# Patient Record
Sex: Female | Born: 1965 | Race: White | Hispanic: No | Marital: Single | Smoking: Current every day smoker
Health system: Southern US, Community
[De-identification: ages and names within clinical notes are randomized; demographics above are authoritative.]

## PROBLEM LIST (undated history)

## (undated) DIAGNOSIS — M545 Low back pain, unspecified: Secondary | ICD-10-CM

## (undated) DIAGNOSIS — F419 Anxiety disorder, unspecified: Secondary | ICD-10-CM

## (undated) DIAGNOSIS — G8929 Other chronic pain: Secondary | ICD-10-CM

## (undated) DIAGNOSIS — M1990 Unspecified osteoarthritis, unspecified site: Secondary | ICD-10-CM

## (undated) DIAGNOSIS — M810 Age-related osteoporosis without current pathological fracture: Secondary | ICD-10-CM

## (undated) DIAGNOSIS — K219 Gastro-esophageal reflux disease without esophagitis: Secondary | ICD-10-CM

## (undated) HISTORY — PX: JOINT REPLACEMENT: SHX530

## (undated) HISTORY — DX: Anxiety disorder, unspecified: F41.9

## (undated) HISTORY — PX: OTHER SURGICAL HISTORY: SHX169

---

## 2017-01-15 ENCOUNTER — Ambulatory Visit
Admission: RE | Admit: 2017-01-15 | Discharge: 2017-01-15 | Disposition: A | Discharge status: Home / Self Care | Payer: 59 | Source: Ambulatory Visit | Attending: Internal Medicine | Admitting: Internal Medicine

## 2017-01-15 ENCOUNTER — Other Ambulatory Visit: Payer: Self-pay | Admitting: Internal Medicine

## 2017-01-15 DIAGNOSIS — M7989 Other specified soft tissue disorders: Secondary | ICD-10-CM | POA: Diagnosis not present

## 2017-01-15 DIAGNOSIS — M79604 Pain in right leg: Secondary | ICD-10-CM

## 2017-05-18 ENCOUNTER — Emergency Department
Admission: EM | Admit: 2017-05-18 | Discharge: 2017-05-18 | Disposition: A | Discharge status: Home / Self Care | Payer: 59 | Attending: Emergency Medicine | Admitting: Emergency Medicine

## 2017-05-18 DIAGNOSIS — F17200 Nicotine dependence, unspecified, uncomplicated: Secondary | ICD-10-CM | POA: Diagnosis not present

## 2017-05-18 DIAGNOSIS — K0889 Other specified disorders of teeth and supporting structures: Secondary | ICD-10-CM | POA: Insufficient documentation

## 2017-05-18 MED ORDER — AMOXICILLIN 500 MG PO CAPS: 2000.0000 mg | ORAL_CAPSULE | Freq: Once | ORAL | 0 refills | Status: AC

## 2017-05-18 MED ORDER — ONDANSETRON 4 MG PO TBDP
4.0000 mg | ORAL_TABLET | Freq: Once | ORAL | Status: AC
  Administered 2017-05-18: 4 mg via ORAL
  Filled 2017-05-18: qty 1

## 2017-05-18 MED ORDER — OXYCODONE-ACETAMINOPHEN 5-325 MG PO TABS
1.0000 | ORAL_TABLET | Freq: Once | ORAL | Status: AC
  Administered 2017-05-18: 1 via ORAL
  Filled 2017-05-18: qty 1

## 2017-05-18 NOTE — ED Triage Notes (Signed)
Pt presents to ED with c/o dental pain. Pt reports she lost the crown on the lower left bottom tooth on Saturday and cannot be seen by her dentist until 1230pm on Wednesday. Pt reports she is here for uncontrollable pain.

## 2017-05-18 NOTE — Discharge Instructions (Signed)
OPTIONS FOR DENTAL FOLLOW UP CARE ° °[place] Department of Health and Human Services - Local Safety Net Dental Clinics °http://www.ncdhhs.gov/dph/oralhealth/services/safetynetclinics.htm °  °[facility] (336-562-3123) ° °[facility] [place] (919-933-9087) ° °[facility] (919-663-1744 ext [phone]) ° °[facility] ([phone]) ° °[facility] ([phone]) °This clinic caters to the indigent population and is on a lottery system. °Location: °[facility], [address], [place] °Clinic Hours: °Wednesdays from 6pm - 9pm, patients seen by a lottery system. °For dates, call or go to www.med.[facility].edu/[facility]/patients/Dental-[facility] °Services: °Cleanings, fillings and simple extractions. °Payment Options: °DENTAL WORK IS FREE OF CHARGE. Bring proof of income or support. °Best way to get seen: °Arrive at 5:15 pm - this is a lottery, NOT first come/first serve, so arriving earlier will not increase your chances of being seen. °  °  °[facility] °[phone] °Select option 1 for emergencies °  °Location: °[facility], [address], [place] °Clinic Hours: °No walk-ins accepted - call the day before to schedule an appointment. °Check in times are 9:30 am and 1:30 pm. °Services: °Simple extractions, temporary fillings, pulpectomy/pulp debridement, uncomplicated abscess drainage. °Payment Options: °PAYMENT IS DUE AT THE TIME OF SERVICE.  Fee is usually $100-200, additional surgical procedures (e.g. abscess drainage) may be extra. °Cash, checks, Visa/MasterCard accepted.  Can file Medicaid if patient is covered for dental - patient should call case worker to check. °No discount for [facility] patients. °Best way to get seen: °MUST call the day before and get onto the schedule. Can usually be seen the next 1-2 days. No walk-ins accepted. °  °  °[facility] °[phone] °   °Location: °[facility], [address], [place] °Clinic Hours: °M, W, Th, F 8am or 1:30pm, Tues 9a or 1:30 - first come/first served. °Services: °Simple extractions, temporary fillings, uncomplicated abscess drainage.  You do not need to be an [place] resident. °Payment Options: °PAYMENT IS DUE AT THE TIME OF SERVICE. °Dental insurance, otherwise sliding scale - bring proof of income or support. °Depending on income and treatment needed, cost is usually $50-200. °Best way to get seen: °Arrive early as it is first come/first served. °  °  °[facility] °[phone] °  °Location: °[address] °Clinic Hours: °Mon-Thu 8a-5p °Services: °Most basic dental services including extractions and fillings. °Payment Options: °PAYMENT IS DUE AT THE TIME OF SERVICE. °Sliding scale, up to 50% off - bring proof if income or support. °Medicaid with dental option accepted. °Best way to get seen: °Call to schedule an appointment, can usually be seen within 2 weeks OR they will try to see walk-ins - show up at 8a or 2p (you may have to wait). °  °  °[facility] °919-245-2435 °[place] RESIDENTS ONLY °  °Location: °[facility], [address], [place], [place] [address] °Clinic Hours: By appointment only. °Monday - Thursday 8am-5pm, Friday 8am-12pm °Services: Cleanings, fillings, extractions. °Payment Options: °PAYMENT IS DUE AT THE TIME OF SERVICE. °Cash, Visa or MasterCard. Sliding scale - $30 minimum per service. °Best way to get seen: °Come in to office, complete packet and make an appointment - need proof of income °or support monies for each household member and proof of [place] residence. °Usually takes about a month to get in. °  °  °[facility] °[phone] °  °Location: °[address]   [place] °Clinic Hours: Walk-in Urgent Care Dental Services are offered Monday-Friday  mornings only. °The numbers of emergencies accepted daily is limited to the number of °providers available. °Maximum 15 - Mondays, Wednesdays & Thursdays °Maximum 10 - Tuesdays & Fridays °Services: °You do not need to be a [place] resident to be seen for a dental emergency. °Emergencies are defined as pain, swelling, abnormal bleeding, or dental trauma. Walkins will receive x-rays if needed. °NOTE: Dental cleaning is not an emergency. °Payment Options: °PAYMENT IS DUE AT THE TIME OF SERVICE. °Minimum co-pay is $40.00 for uninsured patients. °Minimum co-pay is $3.00 for Medicaid with dental coverage. °Dental Insurance is accepted and must be presented at time of visit. °Medicare does not cover dental. °Forms of payment: Cash, credit card, checks. °Best way to get seen: °If not previously registered with the clinic, walk-in dental registration begins at 7:15 am and is on a first come/first serve basis. °If previously registered with the clinic, call to make an appointment. °  °  °[facility] °[phone] °[place] RESIDENTS ONLY °  °Location: °[address], [place] °Clinic Hours: °Mon-Thu 10a-2p °Services: Extractions only! °Payment Options: °FREE (donations accepted) - bring proof of income or support °Best way to get seen: °Call and schedule an appointment OR come at 8am on the 1st Monday of every month (except for holidays) when it is first come/first served. °  °  °[facility] °[phone] °  °Location: °[address], [place] °Clinic Hours: °Friday mornings °Services, Payment Options, Best way to get seen: °Call for info °

## 2017-05-18 NOTE — ED Provider Notes (Signed)
[facility] Emergency Department Provider Note  ____________________________________________  Time seen: Approximately 9:40 PM  I have reviewed the triage vital signs and the nursing notes.   HISTORY  Chief Complaint Dental Pain    HPI Madison Malone is a 51 y.o. female presenting to the emergency department with dental pain from a lost inferior 20. Patient states that her pain has been 10 out of 10 in intensity. Patient has been drinking water for relief. Patient has an appointment with her dentist at 12:00 tomorrow. Patient states that she "just needs something temporarily to get through the night". No alleviating measures have been attempted.   History reviewed. No pertinent past medical history.  There are no active problems to display for this patient.   Past Surgical History:  Procedure Laterality Date  . multiple RIGHT knee surgeries      Prior to Admission medications   Medication Sig Start Date End Date Taking? Authorizing Provider  amoxicillin (AMOXIL) 500 MG capsule Take 4 capsules (2,000 mg total) by mouth once. 05/18/17 05/18/17  Orvil FeilWoods, Braian Tijerina M, PA-C    Allergies Patient has no known allergies.  No family history on file.  Social History Social History  Substance Use Topics  . Smoking status: Current Every Day Smoker  . Smokeless tobacco: Never Used  . Alcohol use No     Review of Systems  Constitutional: No fever/chills Eyes: No visual changes. No discharge ENT: Patient has dental pain from lost inferior 20 Cardiovascular: no chest pain. Respiratory: no cough. No SOB. Musculoskeletal: Negative for musculoskeletal pain. Skin: Negative for rash, abrasions, lacerations, ecchymosis. Neurological: Negative for headaches, focal weakness or numbness. ____________________________________________   PHYSICAL EXAM:  VITAL SIGNS: ED Triage Vitals  Enc Vitals Group     BP 05/18/17 1935 (!) 159/102     Pulse Rate 05/18/17  1935 89     Resp 05/18/17 1935 16     Temp 05/18/17 1935 97.7 F (36.5 C)     Temp Source 05/18/17 1935 Oral     SpO2 05/18/17 1935 100 %     Weight 05/18/17 1936 115 lb (52.2 kg)     Height 05/18/17 1936 5\' 5"  (1.651 m)     Head Circumference --      Peak Flow --      Pain Score 05/18/17 1935 10     Pain Loc --      Pain Edu? --      Excl. in GC? --      Constitutional: Alert and oriented. Well appearing and in no acute distress. Eyes: Conjunctivae are normal. PERRL. EOMI. Head: Atraumatic. ENT:      Nose: No congestion/rhinnorhea.      Mouth/Throat: Mucous membranes are moist. Patient has a lost inferior 20. No gingival hypertrophy. No focal edema at the jaw. Neck: Full range of motion. No pain with palpation along the C-spine. Hematological/Lymphatic/Immunilogical: No cervical lymphadenopathy. Cardiovascular: Normal rate, regular rhythm. Normal S1 and S2.  Good peripheral circulation. Respiratory: Normal respiratory effort without tachypnea or retractions. Lungs CTAB. Good air entry to the bases with no decreased or absent breath sounds. Skin:  Skin is warm, dry and intact. No rash noted. Psychiatric: Mood and affect are normal. Speech and behavior are normal. Patient exhibits appropriate insight and judgement.   ____________________________________________   LABS (all labs ordered are listed, but only abnormal results are displayed)  Labs Reviewed - No data to display ____________________________________________  EKG   ____________________________________________  RADIOLOGY   No  results found.  ____________________________________________    PROCEDURES  Procedure(s) performed:    Procedures    Medications  oxyCODONE-acetaminophen (PERCOCET/ROXICET) 5-325 MG per tablet 1 tablet (1 tablet Oral Given 05/18/17 2144)  ondansetron (ZOFRAN-ODT) disintegrating tablet 4 mg (4 mg Oral Given 05/18/17 2144)      ____________________________________________   INITIAL IMPRESSION / ASSESSMENT AND PLAN / ED COURSE  Pertinent labs & imaging results that were available during my care of the patient were reviewed by me and considered in my medical decision making (see chart for details).  Review of the [place] CSRS was performed in accordance of the [facility] prior to dispensing any controlled drugs.     Assessment and plan: Dental Pain:  Patient presents to the emergency department with dental pain from a lost inferior 20. Patient was given a Roxicet in the emergency department for pain. Patient was advised that she would not be receive narcotics for continued dental pain. Patient voice understanding. Patient has an appointment with a local dentist tomorrow at 12:00. Patient was discharged with amoxicillin for dental procedure. Patient states that she accidentally took her amoxicillin described by her dentist prematurely. Patient's dentist had prescribed 2000 mg to be taken before her dental procedure tomorrow. Vital signs are reassuring at discharge aside from hypertension. All patient questions were answered.     ____________________________________________  FINAL CLINICAL IMPRESSION(S) / ED DIAGNOSES  Final diagnoses:  Pain, dental      NEW MEDICATIONS STARTED DURING THIS VISIT:  New Prescriptions   AMOXICILLIN (AMOXIL) 500 MG CAPSULE    Take 4 capsules (2,000 mg total) by mouth once.        This chart was dictated using voice recognition software/Dragon. Despite best efforts to proofread, errors can occur which can change the meaning. Any change was purely unintentional.    Orvil Feil, PA-C 05/18/17 2151    Arnaldo Natal, MD 05/18/17 [phone]

## 2018-10-17 ENCOUNTER — Emergency Department: Payer: BLUE CROSS/BLUE SHIELD

## 2018-10-17 ENCOUNTER — Other Ambulatory Visit: Payer: Self-pay

## 2018-10-17 ENCOUNTER — Emergency Department
Admission: EM | Admit: 2018-10-17 | Discharge: 2018-10-18 | Disposition: A | Discharge status: Home / Self Care | Payer: BLUE CROSS/BLUE SHIELD | Attending: Emergency Medicine | Admitting: Emergency Medicine

## 2018-10-17 DIAGNOSIS — Y999 Unspecified external cause status: Secondary | ICD-10-CM | POA: Diagnosis not present

## 2018-10-17 DIAGNOSIS — S61511A Laceration without foreign body of right wrist, initial encounter: Secondary | ICD-10-CM | POA: Insufficient documentation

## 2018-10-17 DIAGNOSIS — F17210 Nicotine dependence, cigarettes, uncomplicated: Secondary | ICD-10-CM | POA: Insufficient documentation

## 2018-10-17 DIAGNOSIS — Y939 Activity, unspecified: Secondary | ICD-10-CM | POA: Diagnosis not present

## 2018-10-17 DIAGNOSIS — Y929 Unspecified place or not applicable: Secondary | ICD-10-CM | POA: Diagnosis not present

## 2018-10-17 DIAGNOSIS — W25XXXA Contact with sharp glass, initial encounter: Secondary | ICD-10-CM | POA: Diagnosis not present

## 2018-10-17 MED ORDER — DIAZEPAM 2 MG PO TABS
2.0000 mg | ORAL_TABLET | Freq: Once | ORAL | Status: AC
  Administered 2018-10-17: 2 mg via ORAL
  Filled 2018-10-17: qty 1

## 2018-10-17 NOTE — ED Triage Notes (Signed)
Reports dropped a plate.  Has laceration to right arm.  Per first nurse approximately 1 inch.

## 2018-10-18 MED ORDER — LIDOCAINE HCL (PF) 1 % IJ SOLN
INTRAMUSCULAR | Status: AC
  Administered 2018-10-18: 01:00:00
  Filled 2018-10-18: qty 5

## 2018-10-18 MED ORDER — BACITRACIN-NEOMYCIN-POLYMYXIN 400-5-5000 EX OINT
TOPICAL_OINTMENT | CUTANEOUS | Status: AC
  Filled 2018-10-18: qty 1

## 2018-10-18 NOTE — ED Provider Notes (Signed)
[facility] Emergency Department Provider Note  ____________________________________________  Time seen: Approximately 2:45 AM  I have reviewed the triage vital signs and the nursing notes.   HISTORY  Chief Complaint Laceration   HPI Madison Malone is a 52 y.o. female presenting to the emergency department for treatment and evaluation of laceration to the right wrist.  She states that she dropped a plate, and it bounced off the counter and then a large piece fell and cut her.  She states that it bled very heavily despite applying pressure.  She states that she soaked through 2 or 3 towels and has blood all over the kitchen.  At this time bleeding is well controlled.  Tetanus booster is up-to-date.   No past medical history on file.  There are no active problems to display for this patient.   Past Surgical History:  Procedure Laterality Date  . multiple RIGHT knee surgeries      Prior to Admission medications   Medication Sig Start Date End Date Taking? Authorizing Provider  cyclobenzaprine (FLEXERIL) 10 MG tablet Take 10 mg by mouth 3 (three) times daily as needed for muscle spasms.   Yes [provider]    Allergies Patient has no known allergies.  No family history on file.  Social History Social History   Tobacco Use  . Smoking status: Current Every Day Smoker  . Smokeless tobacco: Never Used  Substance Use Topics  . Alcohol use: No  . Drug use: No    Review of Systems  Constitutional: Negative for fever. Respiratory: Negative for cough or shortness of breath.  Musculoskeletal: Negative for myalgias Skin: Positive for laceration to the right wrist Neurological: Negative for numbness or paresthesias. ____________________________________________   PHYSICAL EXAM:  VITAL SIGNS: ED Triage Vitals  Enc Vitals Group     BP 10/17/18 2148 102/62     Pulse Rate 10/17/18 2148 95     Resp 10/17/18 2148 20     Temp 10/17/18  2148 97.8 F (36.6 C)     Temp Source 10/17/18 2148 Oral     SpO2 10/17/18 2148 97 %     Weight 10/17/18 2147 100 lb (45.4 kg)     Height 10/17/18 2147 5\' 5"  (1.651 m)     Head Circumference --      Peak Flow --      Pain Score 10/18/18 0047 6     Pain Loc --      Pain Edu? --      Excl. in GC? --      Constitutional: Well appearing. Eyes: Conjunctivae are clear without discharge or drainage. Nose: No rhinorrhea noted. Mouth/Throat: Airway is patent.  Neck: No stridor. Unrestricted range of motion observed. Cardiovascular: Capillary refill is <3 seconds.  Radial pulse 2+ Respiratory: Respirations are even and unlabored.. Musculoskeletal: Unrestricted range of motion observed. Neurologic: Awake, alert, and oriented x 4.  Skin: 4 cm laceration to the volar aspect of the right wrist overlying the distal radius.  ____________________________________________   LABS (all labs ordered are listed, but only abnormal results are displayed)  Labs Reviewed - No data to display ____________________________________________  EKG  Not indicated. ____________________________________________  RADIOLOGY  3 mm linear density anterior lateral to the radius questionable for foreign body.  No acute fracture identified on x-ray. ____________________________________________   PROCEDURES  .Marland KitchenLaceration Repair Date/Time: 10/18/2018 2:47 AM Performed by: Chinita Pester, FNP Authorized by: Chinita Pester, FNP   Consent:    Consent obtained:  Verbal   Consent given by:  Patient   Risks discussed:  Pain, poor cosmetic result and retained foreign body Anesthesia (see MAR for exact dosages):    Anesthesia method:  Local infiltration   Local anesthetic:  Lidocaine 1% w/o epi Laceration details:    Location: Right wrist.   Length (cm):  4 Repair type:    Repair type:  Intermediate Pre-procedure details:    Preparation:  Patient was prepped and draped in usual sterile  fashion Exploration:    Hemostasis achieved with:  Direct pressure   Contaminated: no   Treatment:    Area cleansed with:  Betadine and saline   Amount of cleaning:  Standard   Irrigation solution:  Sterile saline   Irrigation method:  Syringe   Visualized foreign bodies/material removed: yes   Subcutaneous repair:    Suture technique:  Horizontal mattress Skin repair:    Repair method:  Sutures   Suture size:  5-0   Suture material:  Nylon Approximation:    Approximation:  Close Post-procedure details:    Dressing:  Antibiotic ointment   Patient tolerance of procedure:  Tolerated well, no immediate complications   ____________________________________________   INITIAL IMPRESSION / ASSESSMENT AND PLAN / ED COURSE  Madison Malone is a 52 y.o. female presenting to the emergency department after a reported accidental laceration to the right wrist.  Mattress sutures were inserted to close the subcu and the outer tissues in the wound edges were slightly everted.  She is aware that she needs to have the sutures removed in 10 to 12 days.  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  She was encouraged to have a provider look at them to make sure that the wound appeared to have healed well before removal.  Wound care was discussed with the patient and she will apply antibiotic ointment 2 times per day and keep it clean and dry.  She is to leave it open to air when at no risk of getting it dirty or wet.  She is to keep it covered before bed.  She was advised to return to the emergency department for symptoms of concern if unable to have her primary care provider see her.   Medications  neomycin-bacitracin-polymyxin (NEOSPORIN) [id number] ointment (has no administration in time range)  diazepam (VALIUM) tablet 2 mg (2 mg Oral Given 10/17/18 2330)  lidocaine (PF) (XYLOCAINE) 1 % injection (  Given by Other 10/18/18 0047)     Pertinent  labs & imaging results that were available during my care of the patient were reviewed by me and considered in my medical decision making (see chart for details).  ____________________________________________   FINAL CLINICAL IMPRESSION(S) / ED DIAGNOSES  Final diagnoses:  Wrist laceration, right, initial encounter    ED Discharge Orders    None       Note:  This document was prepared using Dragon voice recognition software and may include unintentional dictation errors.    Chinita Pester, FNP 10/18/18 1610    Merrily Brittle, MD 10/18/18 [phone]

## 2018-10-18 NOTE — Discharge Instructions (Signed)
Do not get the sutured area wet for 24 hours. After 24 hours, shower/bathe as usual and pat the area dry. °Change the bandage 2 times per day and apply antibiotic ointment. °Leave open to air when at no risk of getting the area dirty, but cover at night before bed. °See your PCP or go to Urgent Care in 10-12 days for suture removal or sooner for signs or concern of infection. ° °

## 2018-11-05 ENCOUNTER — Other Ambulatory Visit: Payer: Self-pay

## 2018-11-05 ENCOUNTER — Emergency Department (HOSPITAL_COMMUNITY)
Admission: EM | Admit: 2018-11-05 | Discharge: 2018-11-05 | Disposition: A | Discharge status: Home / Self Care | Payer: BLUE CROSS/BLUE SHIELD | Attending: Emergency Medicine | Admitting: Emergency Medicine

## 2018-11-05 ENCOUNTER — Encounter (HOSPITAL_COMMUNITY): Payer: Self-pay

## 2018-11-05 ENCOUNTER — Emergency Department (HOSPITAL_COMMUNITY): Payer: BLUE CROSS/BLUE SHIELD

## 2018-11-05 DIAGNOSIS — F17210 Nicotine dependence, cigarettes, uncomplicated: Secondary | ICD-10-CM | POA: Diagnosis not present

## 2018-11-05 DIAGNOSIS — Z5189 Encounter for other specified aftercare: Secondary | ICD-10-CM | POA: Insufficient documentation

## 2018-11-05 DIAGNOSIS — M25531 Pain in right wrist: Secondary | ICD-10-CM | POA: Diagnosis not present

## 2018-11-05 LAB — CBC WITH DIFFERENTIAL/PLATELET
Abs Immature Granulocytes: 0.01 10*3/uL (ref 0.00–0.07)
BASOS PCT: 1 %
Basophils Absolute: 0.1 10*3/uL (ref 0.0–0.1)
EOS ABS: 0.1 10*3/uL (ref 0.0–0.5)
Eosinophils Relative: 3 %
HEMATOCRIT: 36.5 % (ref 36.0–46.0)
Hemoglobin: 12.2 g/dL (ref 12.0–15.0)
Immature Granulocytes: 0 %
Lymphocytes Relative: 32 %
Lymphs Abs: 1.5 10*3/uL (ref 0.7–4.0)
MCH: 34.6 pg — ABNORMAL HIGH (ref 26.0–34.0)
MCHC: 33.4 g/dL (ref 30.0–36.0)
MCV: 103.4 fL — AB (ref 80.0–100.0)
Monocytes Absolute: 0.4 10*3/uL (ref 0.1–1.0)
Monocytes Relative: 9 %
NEUTROS ABS: 2.6 10*3/uL (ref 1.7–7.7)
NEUTROS PCT: 55 %
NRBC: 0 % (ref 0.0–0.2)
Platelets: 260 10*3/uL (ref 150–400)
RBC: 3.53 MIL/uL — ABNORMAL LOW (ref 3.87–5.11)
RDW: 12.8 % (ref 11.5–15.5)
WBC: 4.8 10*3/uL (ref 4.0–10.5)

## 2018-11-05 LAB — BASIC METABOLIC PANEL
Anion gap: 7 (ref 5–15)
BUN: 12 mg/dL (ref 6–20)
CALCIUM: 9 mg/dL (ref 8.9–10.3)
CO2: 25 mmol/L (ref 22–32)
CREATININE: 0.58 mg/dL (ref 0.44–1.00)
Chloride: 108 mmol/L (ref 98–111)
GFR calc Af Amer: >60 mL/min (ref >60)
GFR calc non Af Amer: >60 mL/min (ref >60)
GLUCOSE: 91 mg/dL (ref 70–99)
Potassium: 3.5 mmol/L (ref 3.5–5.1)
Sodium: 140 mmol/L (ref 135–145)

## 2018-11-05 MED ORDER — IOPAMIDOL (ISOVUE-370) INJECTION 76%: 100.0000 mL | Freq: Once | INTRAVENOUS | Status: DC | PRN

## 2018-11-05 MED ORDER — IOPAMIDOL (ISOVUE-370) INJECTION 76%
INTRAVENOUS | Status: AC
  Filled 2018-11-05: qty 100

## 2018-11-05 MED ORDER — SODIUM CHLORIDE (PF) 0.9 % IJ SOLN
INTRAMUSCULAR | Status: AC
  Filled 2018-11-05: qty 50

## 2018-11-05 MED ORDER — IOPAMIDOL (ISOVUE-370) INJECTION 76%
100.0000 mL | Freq: Once | INTRAVENOUS | Status: AC | PRN
  Administered 2018-11-05: 100 mL via INTRAVENOUS

## 2018-11-05 MED ORDER — LIDOCAINE HCL (PF) 1 % IJ SOLN
30.0000 mL | Freq: Once | INTRAMUSCULAR | Status: AC
  Administered 2018-11-05: 30 mL via INTRADERMAL
  Filled 2018-11-05: qty 30

## 2018-11-05 MED ORDER — FENTANYL CITRATE (PF) 100 MCG/2ML IJ SOLN
50.0000 ug | Freq: Once | INTRAMUSCULAR | Status: AC
  Administered 2018-11-05: 50 ug via INTRAVENOUS
  Filled 2018-11-05: qty 2

## 2018-11-05 MED ORDER — CEPHALEXIN 500 MG PO CAPS: 500.0000 mg | ORAL_CAPSULE | Freq: Two times a day (BID) | ORAL | 0 refills | Status: AC

## 2018-11-05 NOTE — ED Notes (Signed)
ED Provider at bedside. EDP [place]  

## 2018-11-05 NOTE — ED Notes (Signed)
SURGERY HAND , MD [place] AT BEDSIDE WITH PT

## 2018-11-05 NOTE — ED Notes (Signed)
Patient transported to CT 

## 2018-11-05 NOTE — ED Notes (Signed)
EDPA Provider at bedside. 

## 2018-11-05 NOTE — ED Triage Notes (Signed)
Patient reports that she had sutures removed from her right wrist 4 days ago. Patient reports that she began having swelling and pain at the site and saw her PCP yesterday. patient states she was told to come to the ED to have the wound opened yesterday, but did not have a rie until today.

## 2018-11-05 NOTE — ED Notes (Signed)
SUTURE CART AT BEDSIDE 

## 2018-11-05 NOTE — Discharge Instructions (Addendum)
You have been seen today for a wound check. Please read and follow all provided instructions.   1. Medications: Keflex (antibiotics), usual home medications 2. Treatment: rest, drink plenty of fluids, use splint, and keep arm elevated.  3. Follow Up: Please follow up with your primary doctor in 5 days to have sutures removed and for discussion of your diagnoses and further evaluation after today's visit; if you do not have a primary care doctor use the resource guide provided to find one; Please return to the ER for any new or worsening symptoms. Schedule an appointment to see Dr. Izora Ribas if symptoms return.   Take medications as prescribed. Return to the emergency room for worsening condition or new concerning symptoms. Follow up with your regular doctor. If you don't have a regular doctor use one of the numbers below to establish a primary care doctor.   Emergency Department Resource Guide 1) Find a Doctor and Pay Out of Pocket Although you won't have to find out who is covered by your insurance plan, it is a good idea to ask around and get recommendations. You will then need to call the office and see if the doctor you have chosen will accept you as a new patient and what types of options they offer for patients who are self-pay. Some doctors offer discounts or will set up payment plans for their patients who do not have insurance, but you will need to ask so you aren't surprised when you get to your appointment.  2) Contact Your Local Health Department Not all health departments have doctors that can see patients for sick visits, but many do, so it is worth a call to see if yours does. If you don't know where your local health department is, you can check in your phone book. The CDC also has a tool to help you locate your state's health department, and many state websites also have listings of all of their local health departments.  3) Find a Walk-in Clinic If your illness is not likely to be very  severe or complicated, you may want to try a walk in clinic. These are popping up all over the country in pharmacies, drugstores, and shopping centers. They're usually staffed by nurse practitioners or physician assistants that have been trained to treat common illnesses and complaints. They're usually fairly quick and inexpensive. However, if you have serious medical issues or chronic medical problems, these are probably not your best option.  No Primary Care Doctor: Call Health Connect at  [phone] - they can help you locate a primary care doctor that  accepts your insurance, provides certain services, etc. Physician Referral [phone]  Emergency Department Resource Guide 1) Find a Doctor and Pay Out of Pocket Although you won't have to find out who is covered by your insurance plan, it is a good idea to ask around and get recommendations. You will then need to call the office and see if the doctor you have chosen will accept you as a new patient and what types of options they offer for patients who are self-pay. Some doctors offer discounts or will set up payment plans for their patients who do not have insurance, but you will need to ask so you aren't surprised when you get to your appointment.  2) Contact Your Local Health Department Not all health departments have doctors that can see patients for sick visits, but many do, so it is worth a call to see if yours does. If you  don't know where your local health department is, you can check in your phone book. The CDC also has a tool to help you locate your state's health department, and many state websites also have listings of all of their local health departments.  3) Find a Walk-in Clinic If your illness is not likely to be very severe or complicated, you may want to try a walk in clinic. These are popping up all over the country in pharmacies, drugstores, and shopping centers. They're usually staffed by nurse practitioners or  physician assistants that have been trained to treat common illnesses and complaints. They're usually fairly quick and inexpensive. However, if you have serious medical issues or chronic medical problems, these are probably not your best option.  No Primary Care Doctor: Call Health Connect at  [phone] - they can help you locate a primary care doctor that  accepts your insurance, provides certain services, etc. Physician Referral Service- [phone]  Chronic Pain Problems: Organization         Address  Phone   Notes  [facility]  [phone] Patients need to be referred by their primary care doctor.   Medication Assistance: Organization         Address  Phone   Notes  [facility] [address] [place], [place] [address] [phone] --Must be a resident of [facility] -- Must have NO insurance coverage whatsoever (no Medicaid/ Medicare, etc.) -- The pt. MUST have a primary care doctor that directs their care regularly and follows them in the community   [facility]ist  [phone]   [employer]  [phone]    Agencies that provide inexpensive medical care: Organization         Address  Phone   Notes  [facility]  [phone]   [facility]    [phone]   [facility] [address] [place], [place] [address] [phone]   [facility] [address], [place] [phone]   [facility]anned [facility]    [phone]   [facility]    [phone]   [facility]  [address], [place] Phone:  [phone], Fax:  [phone] Hours of Operation:  9 am - 6 pm, M-F.  Also accepts Medicaid/Medicare and self-pay.  [facility]  [address], [place] Phone: [phone], Fax: [phone]. Hours of Operation:  8:30 am - 5:30 pm, M-F.  Also  accepts Medicaid and self-pay.  [facility] [place] [address], [place] Phone: [phone]) [phone]   [facility] [address], [place], [place] [phone], Ext. [phone] Mondays & Thursdays: 7-9 AM.  First 15 patients are seen on a first come, first serve basis.    Medicaid-accepting [facility] Providers:  Organization         Address  Phone   Notes  [facility] [address],  [phone] Also accepts self-pay patients.  [facility] [address], [place]  [phone]   [facility] [address], [place] [phone]   [facility] [address] [place] [address], [place] [phone]   Renaye RakersVeita Bland [address], [place]   [phone] Only accepts [employer] [place] Access Medicaid  patients after they have their name applied to their card.   Self-Pay (no insurance) in [facility]:  Organization         Address  Phone   Notes  Sickle Cell Patients, [facility] [address] [place], [place] [phone]   [facility] [address] [place], [place] [phone]   [facility] [place]  [address], [place] [phone]   [facility]/Dr. Osei-Bonsu  [address] [place] or [address], [place] [phone] Phone number for both [place] and [place] locations is the same.  [facility] [address], [place] [phone]   [facility] [address] [place] or [address] [phone]   [facility] [address] [place] [phone], phone; [phone], fax Sees patients 1st and 3rd Saturday of every month.  Must not qualify for public or private insurance (i.e. Medicaid, Medicare, [place] Health Choice, Veterans' Benefits)   Household income should be no more than 200% of the poverty level The clinic cannot treat you if you are pregnant or think you are pregnant  Sexually transmitted diseases are not treated at the clinic.

## 2018-11-05 NOTE — ED Notes (Signed)
ACE WITH SPLINT INTACT

## 2018-11-05 NOTE — Consult Note (Signed)
Reason for Consult:right wrist laceration Referring Physician: ER  CC:My wrist hurts  HPI:  Madison Malone is an 52 y.o. left handed female who presents with swelling of right wrist.  Pt lacerated wrist several weeks ago, was seen in an outside ER, wound sutured closed, referred to Meyer Russel Jefm Bryant) who evaluated her and felt nothing further needed to be done.  Se had sutures removed this past Tuesday, states since then wrist became swollen and tender. CTA and [place] here in ER without evidence of vascular injury    .   Pain is rated at   8 /10 and is described as sharp.  Pain is constant.  Pain is made better by rest/immobilization, worse with motion.   Associated signs/symptoms: reports some altered sensation of thenar area Previous treatment:  Excess bleeding when laceration happened, states since initial visit has bled several times  History reviewed. No pertinent past medical history.  Past Surgical History:  Procedure Laterality Date  . multiple RIGHT knee surgeries      Family History  Problem Relation Age of Onset  . Anxiety disorder Mother   . Kidney failure Father     Social History:  reports that she has been smoking. She has been smoking about 1.00 pack per day. She has never used smokeless tobacco. She reports that she does not drink alcohol or use drugs.  Allergies: No Known Allergies  Medications: I have reviewed the patient's current medications.  Results for orders placed or performed during the hospital encounter of 11/05/18 (from the past 48 hour(s))  CBC with Differential     Status: Abnormal   Collection Time: 11/05/18 11:19 AM  Result Value Ref Range   WBC 4.8 4.0 - 10.5 K/uL   RBC 3.53 (L) 3.87 - 5.11 MIL/uL   Hemoglobin 12.2 12.0 - 15.0 g/dL   HCT 36.5 36.0 - 46.0 %   MCV 103.4 (H) 80.0 - 100.0 fL   MCH 34.6 (H) 26.0 - 34.0 pg   MCHC 33.4 30.0 - 36.0 g/dL   RDW 12.8 11.5 - 15.5 %   Platelets 260 150 - 400 K/uL   nRBC 0.0 0.0 - 0.2 %   Neutrophils  Relative % 55 %   Neutro Abs 2.6 1.7 - 7.7 K/uL   Lymphocytes Relative 32 %   Lymphs Abs 1.5 0.7 - 4.0 K/uL   Monocytes Relative 9 %   Monocytes Absolute 0.4 0.1 - 1.0 K/uL   Eosinophils Relative 3 %   Eosinophils Absolute 0.1 0.0 - 0.5 K/uL   Basophils Relative 1 %   Basophils Absolute 0.1 0.0 - 0.1 K/uL   Immature Granulocytes 0 %   Abs Immature Granulocytes 0.01 0.00 - 0.07 K/uL    Comment: Performed at [facility], [place] [address] [place], [place] [address]  Basic metabolic panel     Status: None   Collection Time: 11/05/18 11:19 AM  Result Value Ref Range   Sodium 140 135 - 145 mmol/L   Potassium 3.5 3.5 - 5.1 mmol/L   Chloride 108 98 - 111 mmol/L   CO2 25 22 - 32 mmol/L   Glucose, Bld 91 70 - 99 mg/dL   BUN 12 6 - 20 mg/dL   Creatinine, Ser 0.58 0.44 - 1.00 mg/dL   Calcium 9.0 8.9 - 10.3 mg/dL   GFR calc non Af Amer >60 >60 mL/min   GFR calc Af Amer >60 >60 mL/min    Comment: (NOTE) The eGFR has been calculated using  the CKD EPI equation. This calculation has not been validated in all clinical situations. eGFR's persistently <60 mL/min signify possible Chronic Kidney Disease.    Anion gap 7 5 - 15    Comment: Performed at [facility], [place] [address], [place], [place] [address]    Ct Angio Up Extrem Right W &/or Wo Contrast  Result Date: 11/05/2018 CLINICAL DATA:  Pain near right wrist. Swollen. Pt states when she presses on the knot that blood shoots out."Patient reports that she had sutures removed from her right wrist 4 days ago. Patient reports that she began having swelling and pain at the site and saw her PCP yesterday. patient states she was told to come to the ED to have the wound opened yesterday, but did not have a rie until today." EXAM: CT ANGIOGRAPHY OF THE RIGHT UPPEREXTREMITY TECHNIQUE: Multidetector CT imaging of the right upperwas performed using the standard protocol during bolus administration of intravenous  contrast. Multiplanar CT image reconstructions and MIPs were obtained to evaluate the vascular anatomy. CONTRAST:  116m ISOVUE-370 IOPAMIDOL (ISOVUE-370) INJECTION 76% COMPARISON:  Radiographs 10/17/2018 FINDINGS: Right subclavian artery: Incompletely visualized only its peripheral segment, unremarkable Right axillary artery unremarkable Right brachial artery normal. Radial, ulnar, and interosseous arteries are patent. 2 cm soft tissue attenuation process in the distal forearm subcutaneous tissues overlying the distal radial artery. Radial and ulnar arteries cross the wrist to supply the hand. Palmar arches and distal branches incompletely evaluated. Regional soft tissues otherwise unremarkable. Review of the MIP images confirms the above findings. IMPRESSION: 1. No right upper extremity arterial abnormality identified. 2. Nonspecific 2 cm process overlying the radial artery in the distal forearm. Primary considerations include hematoma, pseudoaneurysm, seroma, abscess. Electronically Signed   By: DLucrezia EuropeM.D.   On: 11/05/2018 14:47    Pertinent items are noted in HPI. Temp:  [97.7 F (36.5 C)-98.2 F (36.8 C)] 98.2 F (36.8 C) (11/16 1453) Pulse Rate:  [82-106] 91 (11/16 1453) Resp:  [16-19] 18 (11/16 1453) BP: (140-156)/(95-100) 140/95 (11/16 1453) SpO2:  [99 %-100 %] 99 % (11/16 1453) Weight:  [45.4 kg] 45.4 kg (11/16 0951) General appearance: alert and cooperative Resp:  No distress CV: regular Ext:   L wrist with what appears wound dehiscence and buldging hematoma overlying laceration , non pulsatile, allen test + flow with occlusion of ulnar artery, no signif surrounding erythema to suggest abscess  Assessment: S/p laceration with hematoma Plan: Will anesthetize, evacuate hematoma, if active bleeding occurs will take to OR.  Procedure:  Skin cleansed and area around swelling anesthetized with plain lidocaine.  Wound opened, hematoma evacuated.  No active bleeding was encountered.   Wound irrigated with saline.  Loosely closed with 2 4-0 nylon sutures; dressing placed.  Pt tolerated well. Pt to wear splint L wrist, no lifting, excessive bending of wrist, will need sutures removed in ~1 wk.  Janaki Exley CVivien Presto11/16/2019, 4:26 PM

## 2018-11-05 NOTE — ED Notes (Signed)
ED Provider at bedside. LOCKWO[facility] UPDATED PT

## 2018-11-05 NOTE — ED Provider Notes (Signed)
11:15 AM Patient seen in conjunction with Marta AntuHernandez PA-C.  Patient with right wrist laceration, repaired on 10/29.  Patient had subsequent issues with bleeding.  When the stitches were removed she had continued episodes of bleeding with the development of swelling over the cut.  I used bedside ultrasound to better visualize the area.  Patient's radial artery is directly adjacent to the area of swelling.  There appears to be some internal echoes, question hematoma.   Discussed with Dr. Jeraldine LootsLockwood who has seen patient.  Agrees that we should proceed with CT angiography of the forearm and wrist to evaluate for vascular injury.  Otherwise, the hand is perfused.  Patient does have some numbness over the thenar eminence.  She is able to move her wrist and fingers.  I suspect this is from a cutaneous nerve injury.  4:27 PM Spoke with Dr. Izora Ribasoley earlier. He will see patient in ED.   BP (!) 140/95 (BP Location: Left Arm)   Pulse 91   Temp 98.2 F (36.8 C) (Oral)   Resp 18   Ht 5\' 5"  (1.651 m)   Wt 45.4 kg   SpO2 99%   BMI 16.64 kg/m     Renne CriglerGeiple, Rogenia Werntz, PA-C 11/05/18 1627    Gerhard MunchLockwood, Robert, MD 11/06/18 [phone]

## 2018-11-05 NOTE — ED Notes (Signed)
RX SENT TO PHARMACY

## 2018-11-05 NOTE — ED Provider Notes (Signed)
[facility]-EMERGENCY DEPT Provider Note   CSN: [id number] Arrival date & time: 11/05/18  [id number]     History   Chief Complaint Chief Complaint  Patient presents with  . Wound Check    HPI Madison Malone is a 52 y.o. female presenting for a wound check that appears to not be improving. Patient reports constant throbbing pain on the plantar aspect of her right wrist and states pain radiates to her right thumb. Patient reports she had a plate break and fall on her right wrist on 10/28. She was seen in the ER and had sutures placed. Patient reports she continued to have a lot of bleeding after her ER visit and followed up with her PCP. Patient states she was placed on Amoxicillin for 7 days and finished the course of antibiotics. Patient was seen last week at her PCP and her her sutures removed without any concerns. Patient states after her sutures were removed, she started experiencing pain, edema, and color change in her wound. Patient denies discharge from the wound, but states it occasionally continues to have minimal bleeding. Patient reports numbness and pain on her thumb, but reports being able to move all fingers. Patient denies fever, night sweats, shortness of breath, or chest pain. Patient denies a history of diabetes.   HPI  History reviewed. No pertinent past medical history.  There are no active problems to display for this patient.   Past Surgical History:  Procedure Laterality Date  . multiple RIGHT knee surgeries       OB History   None      Home Medications    Prior to Admission medications   Medication Sig Start Date End Date Taking? Authorizing Provider  cyclobenzaprine (FLEXERIL) 10 MG tablet Take 10 mg by mouth 3 (three) times daily as needed for muscle spasms.   Yes [provider]  oxyCODONE-acetaminophen (PERCOCET/ROXICET) 5-325 MG tablet Take 1 tablet by mouth every 6 (six) hours as needed for pain. 11/01/18 11/06/18 Yes  [provider]  cephALEXin (KEFLEX) 500 MG capsule Take 1 capsule (500 mg total) by mouth 2 (two) times daily for 5 days. 11/05/18 11/10/18  Leretha DykesHernandez, Julieana Eshleman P, PA-C    Family History Family History  Problem Relation Age of Onset  . Anxiety disorder Mother   . Kidney failure Father     Social History Social History   Tobacco Use  . Smoking status: Current Every Day Smoker    Packs/day: 1.00  . Smokeless tobacco: Never Used  Substance Use Topics  . Alcohol use: No  . Drug use: No     Allergies   Patient has no known allergies.   Review of Systems Review of Systems  Constitutional: Negative for chills, diaphoresis and fever.  HENT: Negative for nosebleeds.   Respiratory: Negative for cough and shortness of breath.   Cardiovascular: Negative for chest pain.  Gastrointestinal: Negative for abdominal pain, nausea and vomiting.  Skin: Positive for color change and wound.  Allergic/Immunologic: Negative for immunocompromised state.  Neurological: Positive for numbness. Negative for weakness.  Hematological: Does not bruise/bleed easily.  Psychiatric/Behavioral: The patient is not nervous/anxious.      Physical Exam Updated Vital Signs BP (!) 153/92 (BP Location: Left Arm)   Pulse 84   Temp 98.2 F (36.8 C) (Oral)   Resp 17   Ht 5\' 5"  (1.651 m)   Wt 45.4 kg   SpO2 98%   BMI 16.64 kg/m   Physical Exam  Constitutional: She  appears well-developed and well-nourished. No distress.  HENT:  Head: Normocephalic and atraumatic.  Cardiovascular: Normal rate, regular rhythm and normal heart sounds. Exam reveals no gallop and no friction rub.  No murmur heard. Pulmonary/Chest: Effort normal and breath sounds normal. No respiratory distress. She has no wheezes. She has no rales.  Abdominal: Soft. She exhibits no distension. There is no tenderness. There is no guarding.  Musculoskeletal: Normal range of motion.  Neurological: She is alert.  Skin: Skin is warm.  Capillary refill takes less than 2 seconds. Lesion noted. No rash noted. She is not diaphoretic. There is erythema. No cyanosis.     Patient has a 3cm x 3cm purple/black nodule on the volar aspect of her right wrist. Slight surrounding erythema. No discharge noted. Lesion is very tender to palpation. Radial pulse is palpable. Slight sensory deficit noted on right hand when compared to left hand. Patient is able to move fingers without difficulty.   Psychiatric: She has a normal mood and affect.  Nursing note and vitals reviewed.    ED Treatments / Results  Labs (all labs ordered are listed, but only abnormal results are displayed) Labs Reviewed  CBC WITH DIFFERENTIAL/PLATELET - Abnormal; Notable for the following components:      Result Value   RBC 3.53 (*)    MCV 103.4 (*)    MCH 34.6 (*)    All other components within normal limits  BASIC METABOLIC PANEL    EKG None  Radiology Ct Angio Up Extrem Right W &/or Wo Contrast  Result Date: 11/05/2018 CLINICAL DATA:  Pain near right wrist. Swollen. Pt states when she presses on the knot that blood shoots out."Patient reports that she had sutures removed from her right wrist 4 days ago. Patient reports that she began having swelling and pain at the site and saw her PCP yesterday. patient states she was told to come to the ED to have the wound opened yesterday, but did not have a rie until today." EXAM: CT ANGIOGRAPHY OF THE RIGHT UPPEREXTREMITY TECHNIQUE: Multidetector CT imaging of the right upperwas performed using the standard protocol during bolus administration of intravenous contrast. Multiplanar CT image reconstructions and MIPs were obtained to evaluate the vascular anatomy. CONTRAST:  ISOVUE-370 IOPAMIDOL (ISOVUE-370) INJECTION 76% COMPARISON:  Radiographs 10/17/2018 FINDINGS: Right subclavian artery: Incompletely visualized only its peripheral segment, unremarkable Right axillary artery unremarkable Right brachial artery  normal. Radial, ulnar, and interosseous arteries are patent. 2 cm soft tissue attenuation process in the distal forearm subcutaneous tissues overlying the distal radial artery. Radial and ulnar arteries cross the wrist to supply the hand. Palmar arches and distal branches incompletely evaluated. Regional soft tissues otherwise unremarkable. Review of the MIP images confirms the above findings. IMPRESSION: 1. No right upper extremity arterial abnormality identified. 2. Nonspecific 2 cm process overlying the radial artery in the distal forearm. Primary considerations include hematoma, pseudoaneurysm, seroma, abscess. Electronically Signed   By: Corlis Leak M.D.   On: 11/05/2018 14:47    Procedures Procedures (including critical care time)  Medications Ordered in ED Medications  sodium chloride (PF) 0.9 % injection (has no administration in time range)  iopamidol (ISOVUE-370) 76 % injection (has no administration in time range)  iopamidol (ISOVUE-370) 76 % injection 100 mL (has no administration in time range)  iopamidol (ISOVUE-370) 76 % injection 100 mL (100 mLs Intravenous Contrast Given 11/05/18 1351)  fentaNYL (SUBLIMAZE) injection 50 mcg (50 mcg Intravenous Given 11/05/18 1455)  lidocaine (PF) (XYLOCAINE) 1 % injection  30 mL (30 mLs Intradermal Given 11/05/18 1602)  fentaNYL (SUBLIMAZE) injection 50 mcg (50 mcg Intravenous Given 11/05/18 1559)     Initial Impression / Assessment and Plan / ED Course  I have reviewed the triage vital signs and the nursing notes.  Pertinent labs & imaging results that were available during my care of the patient were reviewed by me and considered in my medical decision making (see chart for details).  Clinical Course as of Nov 06 1635  Sat Nov 05, 2018  1355 BMP is unremarkable.    [AH]  1355 WBC is within normal limits.   CBC with Differential(!) [AH]  1501 CT Angio of the upper extremity found a nonspecific 2 cm process overlying the radial artery in  the distal forearm. This result did not determine if lesion is a hematoma, pseudoaneurysm, seroma, or abscess. Will discuss further with attending physician.    CT ANGIO UP EXTREM RIGHT W &/OR WO CONTRAST [AH]  1503 Will consult hand surgery for further assessment of wrist lesion.    [AH]    Clinical Course User Index [AH] Leretha Dykes, PA-C    Patient presents with complaint of wound check. Patient nontoxic appearing, in no apparent distress, vitals WNL, stable.  Labs:  Ordered BMP and CBC to evaluate for kidney function and leukocytosis.   Imaging: Assisted Rhea Bleacher PA-C with bedside ultrasound to evaluate patency of the radial artery. It was not clear with bedside ultrasound. Ordered CT angiogram of the upper extremity to evaluate the patency of the radial artery.   Consults:  Consulted hand surgery to evaluate the patient.   Therapeutics: Provided Fentanyl for pain relief.   Assessment/Plan: Suspect lesion could be a hematoma, pseudoaneurysm, seroma, or abscess. Will not drain due to complexity of the location of lesion. Will need further assessment from hand surgery. Hand surgery evaluated the patient and discussed incision and drainage of the lesion. Patient agreed and hand surgery performed an  I&D. Hand Surgeon instructed patient to follow up with PCP to have sutures removed and continue to wear splint. Hand surgeon suggested patient come back and be seen outpatient by hand surgery if symptoms return. Hand surgery mentioned that if symptoms return, patient will have to go to the OR. Provided Keflex as hand surgery recommended.   Doubt need for further emergent work up at this time. I discussed results, treatment plan, need for PCP follow-up, and return precautions to return to the ER including for any other new or worsening symptoms with the patient. Provided opportunity for questions, patient confirmed understanding and is in agreement with plan. I have answered their  questions. Discharge instructions concerning home care and prescriptions have been given. The patient is STABLE and is discharged to home in good condition. Encouraged patient to follow up with PCP and have PCP obtain results of this visit in 5 days or sooner if needed.    Final Clinical Impressions(s) / ED Diagnoses   Final diagnoses:  Visit for wound check    ED Discharge Orders         Ordered    cephALEXin (KEFLEX) 500 MG capsule  2 times daily     11/05/18 1633           Leretha Dykes, [place] 11/05/18 1636    Gerhard Munch, MD 11/06/18 [phone]

## 2019-05-25 IMAGING — CT CT ANGIO EXTREM UP*R*
2 of 7 series · 9 of 46 positions shown, 10 images · IV contrast (ISOVUE)
Comparison: Radiographs 10/17/2018

CLINICAL DATA: Pain near right wrist. Swollen. Pt states when she
presses on the knot that blood shoots out."Patient reports that she
had sutures removed from her right wrist 4 days ago. Patient reports
that she began having swelling and pain at the site and saw her PCP
yesterday. patient states she was told to come to the ED to have the
wound opened yesterday, but did not have Kaumi Justina until today."

EXAM:
CT ANGIOGRAPHY OF THE RIGHT UPPEREXTREMITY
TECHNIQUE: Multidetector CT imaging of the right upperwas performed using the
standard protocol during bolus administration of intravenous
contrast. Multiplanar CT image reconstructions and MIPs were
obtained to evaluate the vascular anatomy.
CONTRAST:  100mL P3LT4K-1S1 IOPAMIDOL (P3LT4K-1S1) INJECTION 76%

[Series 4: axial arterial · axial · arterial · 0.30mm/px · z∈[+1626,+2190]mm · 6 of 252 slices shown, 7 images]
[im 32/252  soft-tissue]
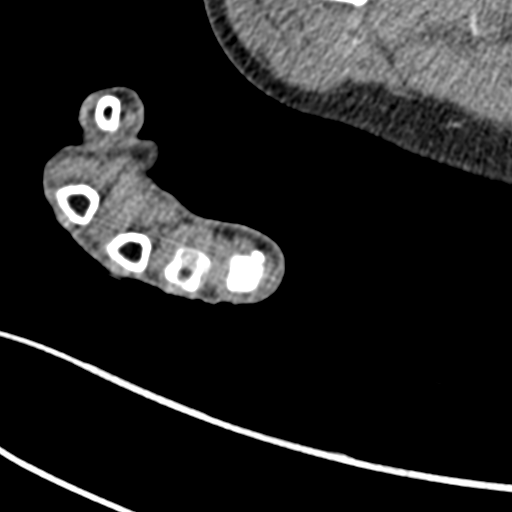
[im 32/252  bone]
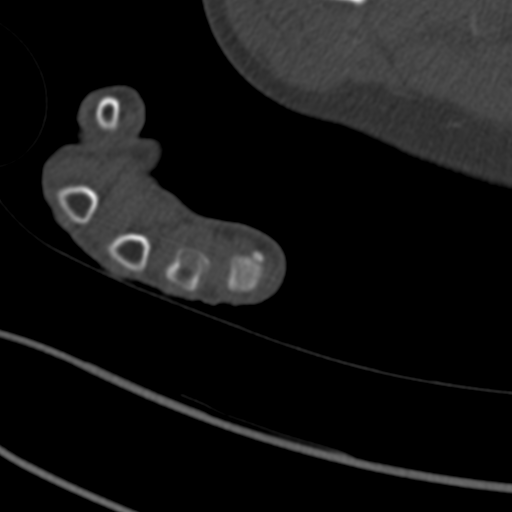
[im 63/252  soft-tissue]
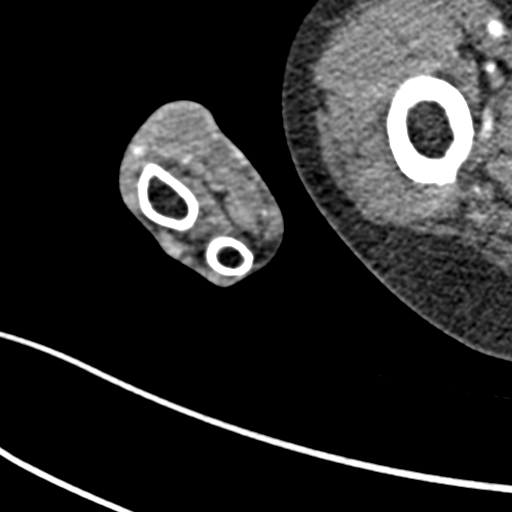
[im 95/252  soft-tissue]
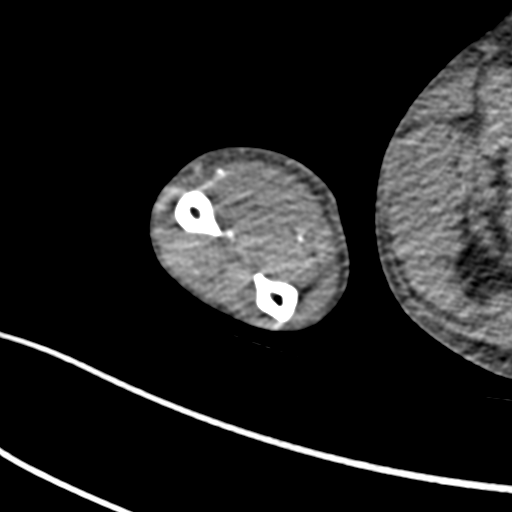
[im 157/252  soft-tissue]
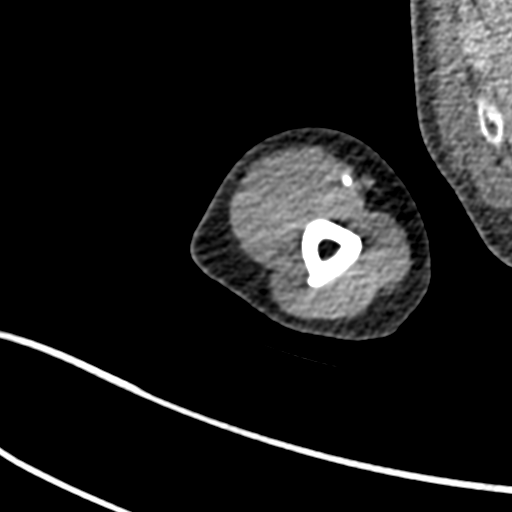
[im 189/252  soft-tissue]
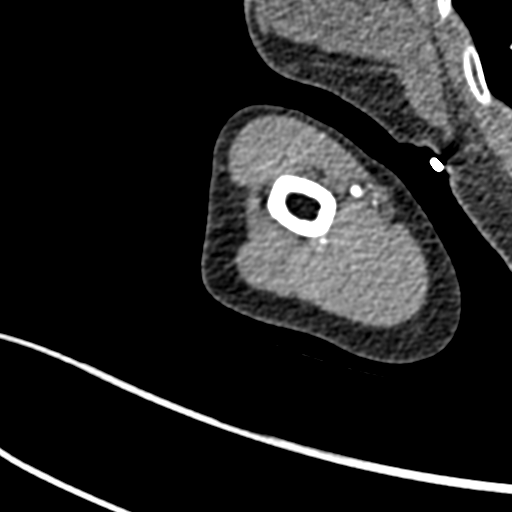
[im 220/252  soft-tissue]
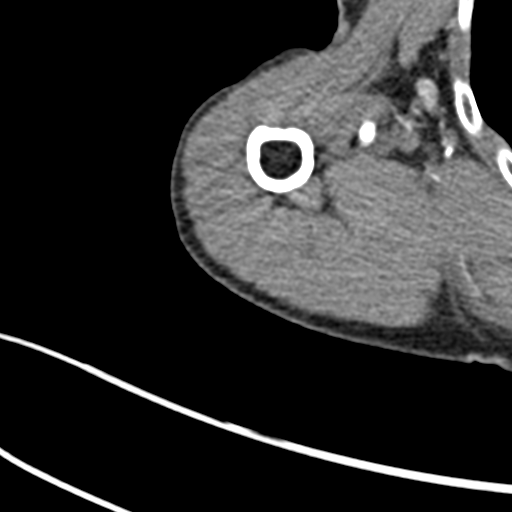

[Series 5: coronals · coronal · 0.42mm/px · 3 of 57 slices shown]
[im 15/57  soft-tissue]
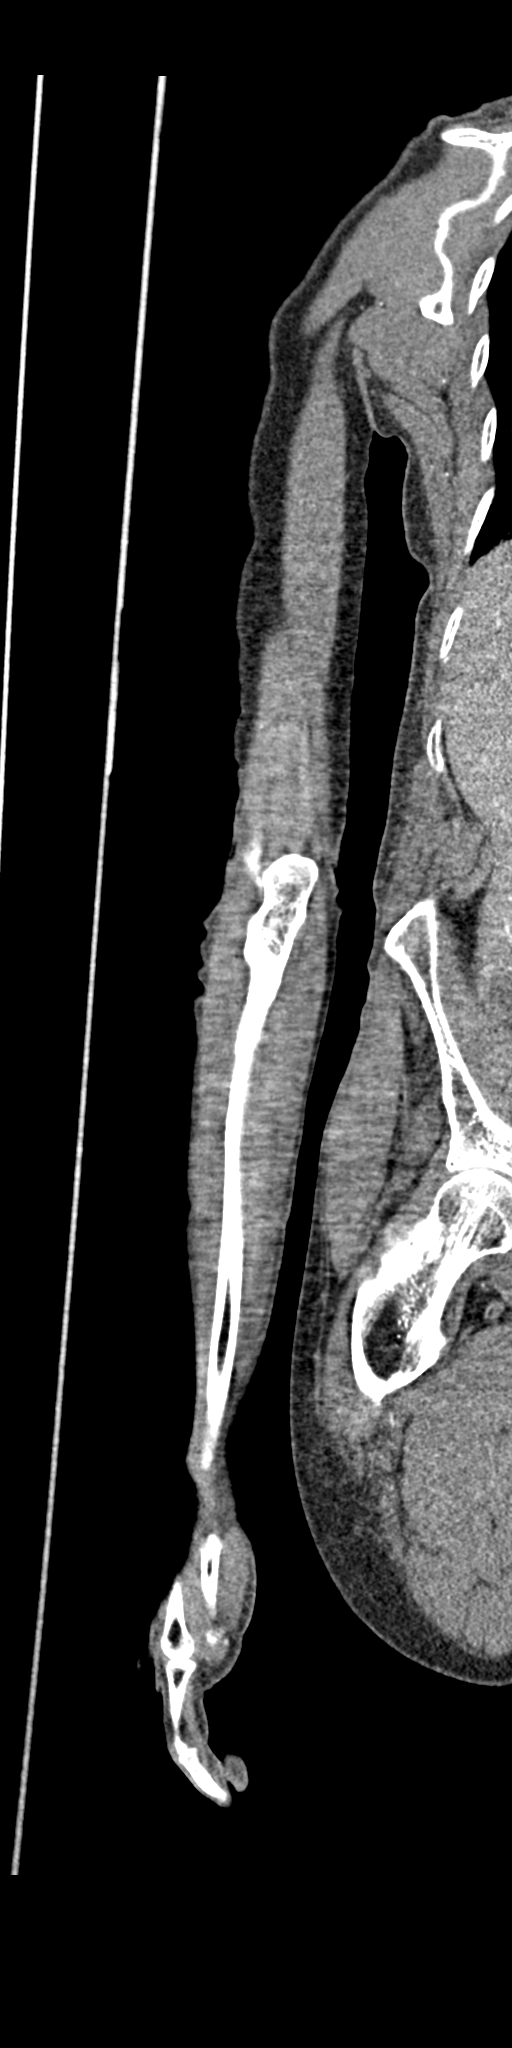
[im 29/57  soft-tissue]
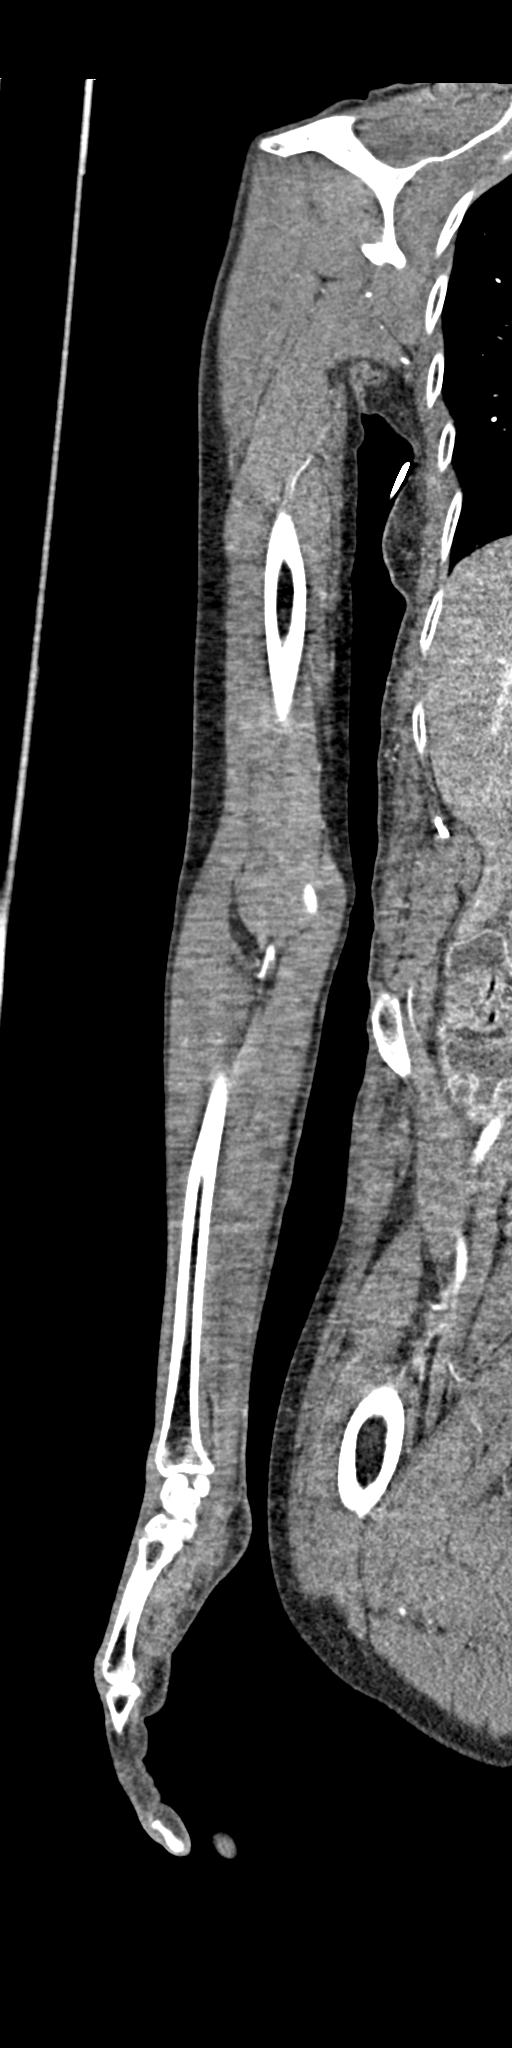
[im 43/57  soft-tissue]
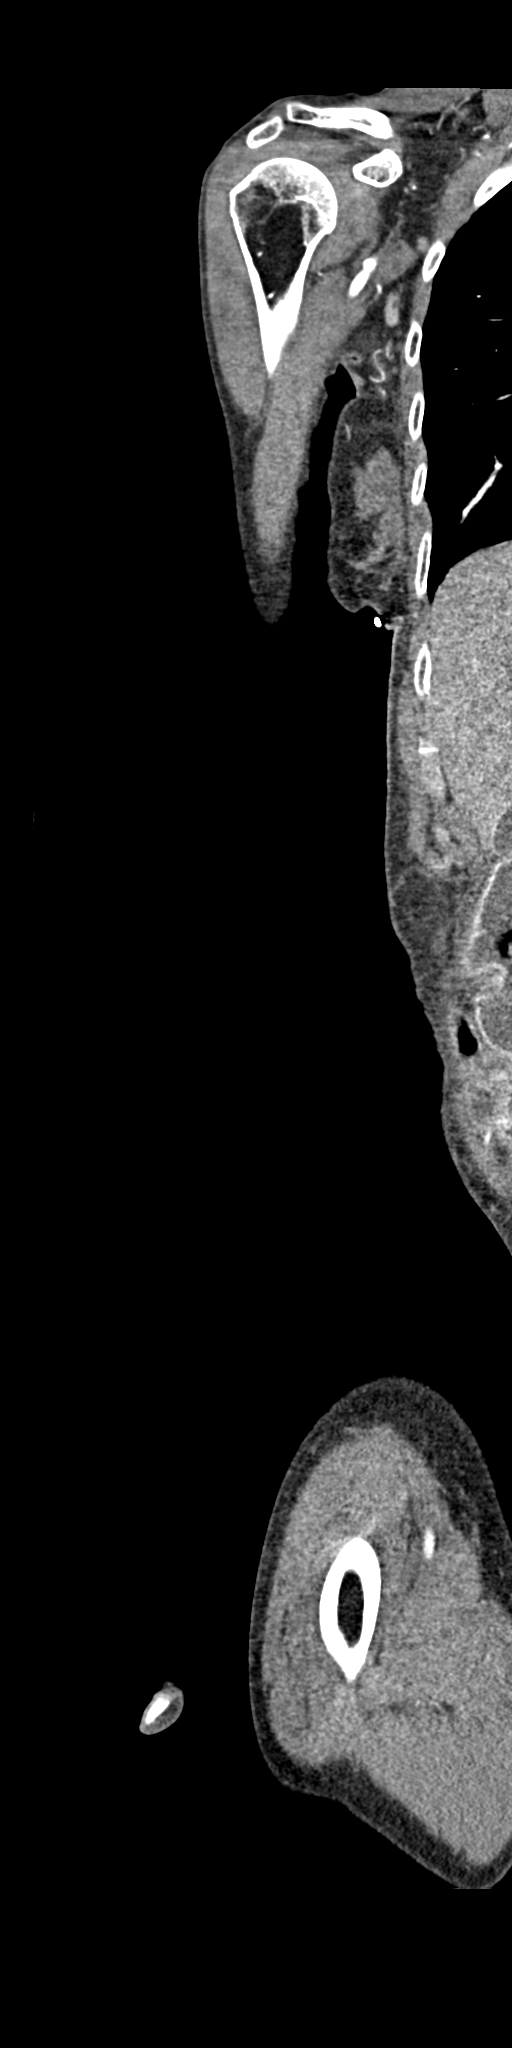

[9 of 46 positions shown; findings below may reference images not displayed]

FINDINGS: Right subclavian artery: Incompletely visualized only its peripheral
segment, unremarkable

Right axillary artery unremarkable

Right brachial artery normal.

Radial, ulnar, and interosseous arteries are patent.

2 cm soft tissue attenuation process in the distal forearm
subcutaneous tissues overlying the distal radial artery. Radial and
ulnar arteries cross the wrist to supply the hand. Palmar arches and
distal branches incompletely evaluated.

Regional soft tissues otherwise unremarkable.

Review of the MIP images confirms the above findings.
IMPRESSION: 1. No right upper extremity arterial abnormality identified.
2. Nonspecific 2 cm process overlying the radial artery in the
distal forearm. Primary considerations include hematoma,
pseudoaneurysm, seroma, abscess.

## 2022-06-09 DIAGNOSIS — S62515D Nondisplaced fracture of proximal phalanx of left thumb, subsequent encounter for fracture with routine healing: Secondary | ICD-10-CM | POA: Diagnosis not present

## 2022-06-09 DIAGNOSIS — S62512D Displaced fracture of proximal phalanx of left thumb, subsequent encounter for fracture with routine healing: Secondary | ICD-10-CM | POA: Diagnosis not present

## 2022-06-24 DIAGNOSIS — M810 Age-related osteoporosis without current pathological fracture: Secondary | ICD-10-CM | POA: Diagnosis not present

## 2022-06-24 DIAGNOSIS — Z0000 Encounter for general adult medical examination without abnormal findings: Secondary | ICD-10-CM | POA: Diagnosis not present

## 2022-07-10 DIAGNOSIS — S62512A Displaced fracture of proximal phalanx of left thumb, initial encounter for closed fracture: Secondary | ICD-10-CM | POA: Diagnosis not present

## 2022-08-21 ENCOUNTER — Emergency Department: Payer: 59

## 2022-08-21 ENCOUNTER — Emergency Department
Admission: EM | Admit: 2022-08-21 | Discharge: 2022-08-22 | Disposition: A | Discharge status: Other Acute Inpatient Hospital | Payer: 59 | Attending: Emergency Medicine | Admitting: Emergency Medicine

## 2022-08-21 ENCOUNTER — Other Ambulatory Visit: Payer: Self-pay

## 2022-08-21 DIAGNOSIS — S72001A Fracture of unspecified part of neck of right femur, initial encounter for closed fracture: Secondary | ICD-10-CM

## 2022-08-21 DIAGNOSIS — S72041A Displaced fracture of base of neck of right femur, initial encounter for closed fracture: Secondary | ICD-10-CM | POA: Insufficient documentation

## 2022-08-21 DIAGNOSIS — W010XXA Fall on same level from slipping, tripping and stumbling without subsequent striking against object, initial encounter: Secondary | ICD-10-CM | POA: Insufficient documentation

## 2022-08-21 DIAGNOSIS — Z96651 Presence of right artificial knee joint: Secondary | ICD-10-CM | POA: Insufficient documentation

## 2022-08-21 DIAGNOSIS — R000 Tachycardia, unspecified: Secondary | ICD-10-CM | POA: Diagnosis not present

## 2022-08-21 DIAGNOSIS — R9431 Abnormal electrocardiogram [ECG] [EKG]: Secondary | ICD-10-CM | POA: Diagnosis not present

## 2022-08-21 DIAGNOSIS — S299XXA Unspecified injury of thorax, initial encounter: Secondary | ICD-10-CM | POA: Diagnosis not present

## 2022-08-21 DIAGNOSIS — S79911A Unspecified injury of right hip, initial encounter: Secondary | ICD-10-CM | POA: Diagnosis not present

## 2022-08-21 MED ORDER — MORPHINE SULFATE (PF) 4 MG/ML IV SOLN
4.0000 mg | Freq: Once | INTRAVENOUS | Status: AC
  Administered 2022-08-22: 4 mg via INTRAVENOUS
  Filled 2022-08-21: qty 1

## 2022-08-21 NOTE — ED Provider Notes (Signed)
[facility] Provider Note    Event Date/Time   First MD Initiated Contact with Patient 08/21/22 2347     (approximate)   History   Fall   HPI  Madison Malone is a 56 y.o. female who presents to the ED from home status post mechanical fall with right hip pain.  Patient tripped over her dog's leash and fell onto her right hip.  Denies striking head or LOC.  Denies neck pain, chest pain, shortness of breath, abdominal pain, nausea, vomiting or dizziness.  Denies use of anticoagulants.     Past Medical History   Painful total knee replacement (CMS-HCC)   Gastroesophageal reflux disease with esophagitis   Left ankle pain   Left ankle tendinitis   Other constipation   Other dysphagia   Chronic low back pain   Healthcare maintenance   Age-related osteoporosis without current pathological fracture     Active Problem List   Patient Active Problem List   Diagnosis Date Noted   Closed displaced fracture of right femoral neck (HCC) 08/22/2022   Osteoporosis 08/22/2022     Past Surgical History   Past Surgical History:  Procedure Laterality Date   multiple RIGHT knee surgeries       Home Medications   Prior to Admission medications   Medication Sig Start Date End Date Taking? Authorizing Provider  cyclobenzaprine (FLEXERIL) 10 MG tablet Take 10 mg by mouth 3 (three) times daily as needed for muscle spasms.    [provider]     Allergies  Patient has no known allergies.   Family History   Family History  Problem Relation Age of Onset   Hypertension Mother    Osteoarthritis Mother    Anxiety disorder Mother    Kidney failure Father    Alcohol abuse Sister      Physical Exam  Triage Vital Signs: ED Triage Vitals  Enc Vitals Group     BP 08/21/22 2022 117/77     Pulse Rate 08/21/22 2022 (!) 109     Resp 08/21/22 2022 18     Temp 08/21/22 2022 98.2 F (36.8 C)     Temp Source 08/21/22 2022 Oral     SpO2 08/21/22  2022 99 %     Weight 08/21/22 2021 107 lb (48.5 kg)     Height 08/21/22 2021 5\' 5"  (1.651 m)     Head Circumference --      Peak Flow --      Pain Score 08/21/22 2028 10     Pain Loc --      Pain Edu? --      Excl. in GC? --     Updated Vital Signs: BP (!) 164/97 (BP Location: Left Arm)   Pulse 94   Temp 98.4 F (36.9 C) (Oral)   Resp 16   Ht 5\' 5"  (1.651 m)   Wt 48.5 kg   SpO2 97%   BMI 17.81 kg/m    General: Awake, mild to moderate distress.  CV:  Tachycardiac.  Good peripheral perfusion.  Resp:  Normal effort.  CTA B. Abd:  Nontender.  No distention.  Other:  Right lower leg shortened and externally rotated.  Pelvis is stable.  Limited range of hip secondary to pain.  2+ distal pulses.  Brisk, less than 5-second cap refill.   ED Results / Procedures / Treatments  Labs (all labs ordered are listed, but only abnormal results are displayed) Labs Reviewed  CBC WITH DIFFERENTIAL/PLATELET -  Abnormal; Notable for the following components:      Result Value   RBC 3.65 (*)    MCV 104.1 (*)    MCH 35.1 (*)    All other components within normal limits  COMPREHENSIVE METABOLIC PANEL - Abnormal; Notable for the following components:   Potassium 3.4 (*)    CO2 21 (*)    Calcium 8.8 (*)    Total Bilirubin <0.1 (*)    All other components within normal limits  PROTIME-INR  TROPONIN I (HIGH SENSITIVITY)     EKG  Patient refused   RADIOLOGY I have independently visualized and interpreted patient's chest and right hip x-rays as well as noted the radiology interpretation:  Chest x-ray: No acute chest findings  Right hip x-ray: Displaced right femoral neck fracture  Official radiology report(s): DG Chest 1 View  Result Date: 08/21/2022 CLINICAL DATA:  Post fall with right hip injury. EXAM: CHEST  1 VIEW COMPARISON:  None Available. FINDINGS: The cardiomediastinal contours are normal. The lungs are clear. Pulmonary vasculature is normal. No consolidation, pleural  effusion, or pneumothorax. No acute osseous abnormalities are seen. IMPRESSION: No acute chest findings. Electronically Signed   By: Narda Rutherford M.D.   On: 08/21/2022 22:32   DG Hip Unilat  With Pelvis 2-3 Views Right  Result Date: 08/21/2022 CLINICAL DATA:  Post fall with right hip injury. EXAM: DG HIP (WITH OR WITHOUT PELVIS) 2-3V RIGHT COMPARISON:  None Available. FINDINGS: Displaced right femoral neck fracture. There is proximal migration of the femoral shaft. The femoral head remains seated. The pubic rami are intact. No additional fracture of the pelvis. The pubic symphysis and sacroiliac joints are congruent. IMPRESSION: Displaced right femoral neck fracture. Electronically Signed   By: Narda Rutherford M.D.   On: 08/21/2022 22:31     PROCEDURES:  Critical Care performed: Yes, see critical care procedure note(s)  CRITICAL CARE Performed by: Irean Hong   Total critical care time: 30 minutes  Critical care time was exclusive of separately billable procedures and treating other patients.  Critical care was necessary to treat or prevent imminent or life-threatening deterioration.  Critical care was time spent personally by me on the following activities: development of treatment plan with patient and/or surrogate as well as nursing, discussions with consultants, evaluation of patient's response to treatment, examination of patient, obtaining history from patient or surrogate, ordering and performing treatments and interventions, ordering and review of laboratory studies, ordering and review of radiographic studies, pulse oximetry and re-evaluation of patient's condition.   Marland Kitchen1-3 Lead EKG Interpretation  Performed by: Irean Hong, MD Authorized by: Irean Hong, MD     Interpretation: abnormal     ECG rate:  110   ECG rate assessment: tachycardic     Rhythm: sinus tachycardia     Ectopy: none     Conduction: normal   Comments:     Patient placed on cardiac monitor to  evaluate for arrhythmias    MEDICATIONS ORDERED IN ED: Medications  morphine (PF) 4 MG/ML injection 4 mg (4 mg Intravenous Given 08/22/22 0009)  HYDROmorphone (DILAUDID) injection 1 mg (1 mg Intravenous Given 08/22/22 0052)  diazepam (VALIUM) injection 2.5 mg (2.5 mg Intravenous Given 08/22/22 0158)  HYDROmorphone (DILAUDID) injection 1 mg (1 mg Intravenous Given 08/22/22 0401)     IMPRESSION / MDM / ASSESSMENT AND PLAN / ED COURSE  I reviewed the triage vital signs and the nursing notes.  56 year old female presenting with right hip pain status post mechanical fall.  Differential diagnosis includes but is not limited to contusion, fracture, dislocation, etc.  I personally reviewed patient's records and note that patient had a [facility] office visit on 07/10/2022 with Dr. Rosita Kea for left thumb fracture.  Patient's presentation is most consistent with acute presentation with potential threat to life or bodily function.  X-ray demonstrates displaced right femoral neck fracture.  Will discuss with orthopedist on-call.  Clinical Course as of 08/22/22 0612  Sat Aug 22, 2022  0015 Spoke with Dr. Okey Dupre who feels that given patient's young age she would benefit most from total hip replacement.  Unfortunately he does not perform THR's.  I did also speak with Dr. Ernest Pine who is on-call for [facility] as patient has seen Dr. Rosita Kea as recently as July.  He is unable to operate on her this weekend.  Have updated the patient and will initiate calls for transfer. [JS]  [id number] Spoke with [facility] orthopedist on-call Dr. Sherilyn Dacosta who does agree with transfer.  [facility] to page out hospitalist services for transfer acceptance.  Patient updated. [JS]  [id number] Patient refuses EKG.  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 [JS]  [id number] Accepted by Dr. Loney Loh from [facility] hospitalist services. [JS]     Clinical Course User Index [JS] Irean Hong, MD     FINAL CLINICAL IMPRESSION(S) / ED DIAGNOSES   Final diagnoses:  Closed fracture of right hip, initial encounter [facility])     Rx / DC Orders   ED Discharge Orders     None        Note:  This document was prepared using Dragon voice recognition software and may include unintentional dictation errors.   Irean Hong, MD 08/22/22 [phone]

## 2022-08-21 NOTE — ED Notes (Signed)
Strecher secured from hallway for pt comfort while waiting.

## 2022-08-21 NOTE — ED Triage Notes (Signed)
Pt states she fell injuring her "si" joint on right side pt states she cannot stand or walk due to pain. Pt states she tripped over her dog's tie out.

## 2022-08-22 ENCOUNTER — Inpatient Hospital Stay (HOSPITAL_COMMUNITY): Payer: 59 | Admitting: Anesthesiology

## 2022-08-22 ENCOUNTER — Encounter (HOSPITAL_COMMUNITY)
Admission: RE | Disposition: A | Discharge status: Home Health | Payer: Self-pay | Source: Ambulatory Visit | Attending: Internal Medicine

## 2022-08-22 ENCOUNTER — Inpatient Hospital Stay (HOSPITAL_COMMUNITY): Payer: 59

## 2022-08-22 ENCOUNTER — Encounter (HOSPITAL_COMMUNITY): Payer: Self-pay

## 2022-08-22 ENCOUNTER — Other Ambulatory Visit: Payer: Self-pay

## 2022-08-22 ENCOUNTER — Inpatient Hospital Stay (HOSPITAL_COMMUNITY)
Admission: RE | Admit: 2022-08-22 | Discharge: 2022-08-25 | DRG: 522 | Disposition: A | Discharge status: Home Health | Payer: 59 | Source: Ambulatory Visit | Attending: Internal Medicine | Admitting: Internal Medicine

## 2022-08-22 ENCOUNTER — Encounter (HOSPITAL_COMMUNITY): Payer: Self-pay | Admitting: Internal Medicine

## 2022-08-22 DIAGNOSIS — Z96651 Presence of right artificial knee joint: Secondary | ICD-10-CM | POA: Diagnosis not present

## 2022-08-22 DIAGNOSIS — R69 Illness, unspecified: Secondary | ICD-10-CM | POA: Diagnosis not present

## 2022-08-22 DIAGNOSIS — G8929 Other chronic pain: Secondary | ICD-10-CM | POA: Diagnosis present

## 2022-08-22 DIAGNOSIS — S72001A Fracture of unspecified part of neck of right femur, initial encounter for closed fracture: Secondary | ICD-10-CM

## 2022-08-22 DIAGNOSIS — S72041A Displaced fracture of base of neck of right femur, initial encounter for closed fracture: Secondary | ICD-10-CM | POA: Diagnosis not present

## 2022-08-22 DIAGNOSIS — K219 Gastro-esophageal reflux disease without esophagitis: Secondary | ICD-10-CM | POA: Diagnosis not present

## 2022-08-22 DIAGNOSIS — F17210 Nicotine dependence, cigarettes, uncomplicated: Secondary | ICD-10-CM | POA: Diagnosis present

## 2022-08-22 DIAGNOSIS — M545 Low back pain, unspecified: Secondary | ICD-10-CM | POA: Diagnosis present

## 2022-08-22 DIAGNOSIS — Z96641 Presence of right artificial hip joint: Secondary | ICD-10-CM | POA: Diagnosis not present

## 2022-08-22 DIAGNOSIS — Z8249 Family history of ischemic heart disease and other diseases of the circulatory system: Secondary | ICD-10-CM | POA: Diagnosis not present

## 2022-08-22 DIAGNOSIS — Z043 Encounter for examination and observation following other accident: Secondary | ICD-10-CM | POA: Diagnosis not present

## 2022-08-22 DIAGNOSIS — M810 Age-related osteoporosis without current pathological fracture: Secondary | ICD-10-CM | POA: Diagnosis present

## 2022-08-22 DIAGNOSIS — E860 Dehydration: Secondary | ICD-10-CM | POA: Diagnosis not present

## 2022-08-22 DIAGNOSIS — D62 Acute posthemorrhagic anemia: Secondary | ICD-10-CM | POA: Diagnosis not present

## 2022-08-22 DIAGNOSIS — Z841 Family history of disorders of kidney and ureter: Secondary | ICD-10-CM

## 2022-08-22 DIAGNOSIS — E559 Vitamin D deficiency, unspecified: Secondary | ICD-10-CM | POA: Diagnosis present

## 2022-08-22 DIAGNOSIS — Z471 Aftercare following joint replacement surgery: Secondary | ICD-10-CM | POA: Diagnosis not present

## 2022-08-22 DIAGNOSIS — S79911A Unspecified injury of right hip, initial encounter: Secondary | ICD-10-CM | POA: Diagnosis not present

## 2022-08-22 DIAGNOSIS — W010XXA Fall on same level from slipping, tripping and stumbling without subsequent striking against object, initial encounter: Secondary | ICD-10-CM | POA: Diagnosis present

## 2022-08-22 DIAGNOSIS — R600 Localized edema: Secondary | ICD-10-CM | POA: Diagnosis not present

## 2022-08-22 DIAGNOSIS — Z818 Family history of other mental and behavioral disorders: Secondary | ICD-10-CM | POA: Diagnosis not present

## 2022-08-22 DIAGNOSIS — Z9889 Other specified postprocedural states: Secondary | ICD-10-CM | POA: Diagnosis not present

## 2022-08-22 DIAGNOSIS — M8000XA Age-related osteoporosis with current pathological fracture, unspecified site, initial encounter for fracture: Secondary | ICD-10-CM

## 2022-08-22 DIAGNOSIS — Z01818 Encounter for other preprocedural examination: Secondary | ICD-10-CM | POA: Diagnosis not present

## 2022-08-22 DIAGNOSIS — E871 Hypo-osmolality and hyponatremia: Secondary | ICD-10-CM | POA: Diagnosis not present

## 2022-08-22 HISTORY — DX: Age-related osteoporosis without current pathological fracture: M81.0

## 2022-08-22 HISTORY — DX: Other chronic pain: G89.29

## 2022-08-22 HISTORY — DX: Low back pain, unspecified: M54.50

## 2022-08-22 HISTORY — PX: TOTAL HIP ARTHROPLASTY: SHX124

## 2022-08-22 HISTORY — DX: Gastro-esophageal reflux disease without esophagitis: K21.9

## 2022-08-22 HISTORY — DX: Unspecified osteoarthritis, unspecified site: M19.90

## 2022-08-22 LAB — SURGICAL PCR SCREEN
MRSA, PCR: NEGATIVE
Staphylococcus aureus: NEGATIVE

## 2022-08-22 LAB — CBC WITH DIFFERENTIAL/PLATELET
Abs Immature Granulocytes: 0.02 10*3/uL (ref 0.00–0.07)
Basophils Absolute: 0 10*3/uL (ref 0.0–0.1)
Basophils Relative: 0 %
Eosinophils Absolute: 0.1 10*3/uL (ref 0.0–0.5)
Eosinophils Relative: 1 %
HCT: 38 % (ref 36.0–46.0)
Hemoglobin: 12.8 g/dL (ref 12.0–15.0)
Immature Granulocytes: 0 %
Lymphocytes Relative: 12 %
Lymphs Abs: 0.9 10*3/uL (ref 0.7–4.0)
MCH: 35.1 pg — ABNORMAL HIGH (ref 26.0–34.0)
MCHC: 33.7 g/dL (ref 30.0–36.0)
MCV: 104.1 fL — ABNORMAL HIGH (ref 80.0–100.0)
Monocytes Absolute: 0.6 10*3/uL (ref 0.1–1.0)
Monocytes Relative: 8 %
Neutro Abs: 6.2 10*3/uL (ref 1.7–7.7)
Neutrophils Relative %: 79 %
Platelets: 256 10*3/uL (ref 150–400)
RBC: 3.65 MIL/uL — ABNORMAL LOW (ref 3.87–5.11)
RDW: 12.2 % (ref 11.5–15.5)
WBC: 7.7 10*3/uL (ref 4.0–10.5)
nRBC: 0 % (ref 0.0–0.2)

## 2022-08-22 LAB — PROTIME-INR
INR: 1 (ref 0.8–1.2)
Prothrombin Time: 12.9 seconds (ref 11.4–15.2)

## 2022-08-22 LAB — COMPREHENSIVE METABOLIC PANEL
ALT: 18 U/L (ref 0–44)
AST: 21 U/L (ref 15–41)
Albumin: 4.1 g/dL (ref 3.5–5.0)
Alkaline Phosphatase: 58 U/L (ref 38–126)
Anion gap: 11 (ref 5–15)
BUN: 16 mg/dL (ref 6–20)
CO2: 21 mmol/L — ABNORMAL LOW (ref 22–32)
Calcium: 8.8 mg/dL — ABNORMAL LOW (ref 8.9–10.3)
Chloride: 104 mmol/L (ref 98–111)
Creatinine, Ser: 0.6 mg/dL (ref 0.44–1.00)
GFR, Estimated: >60 mL/min (ref >60)
Glucose, Bld: 95 mg/dL (ref 70–99)
Potassium: 3.4 mmol/L — ABNORMAL LOW (ref 3.5–5.1)
Sodium: 136 mmol/L (ref 135–145)
Total Bilirubin: <0.1 mg/dL — ABNORMAL LOW (ref 0.3–1.2)
Total Protein: 7.2 g/dL (ref 6.5–8.1)

## 2022-08-22 LAB — TROPONIN I (HIGH SENSITIVITY): Troponin I (High Sensitivity): 4 ng/L (ref <18)

## 2022-08-22 SURGERY — ARTHROPLASTY, HIP, TOTAL,POSTERIOR APPROACH
Anesthesia: Spinal | Site: Hip | Laterality: Right

## 2022-08-22 MED ORDER — SODIUM CHLORIDE (PF) 0.9 % IJ SOLN
INTRAMUSCULAR | Status: AC
Start: 2022-08-22 — End: ?
  Filled 2022-08-22: qty 50

## 2022-08-22 MED ORDER — LACTATED RINGERS IV SOLN: INTRAVENOUS | Status: DC

## 2022-08-22 MED ORDER — CEFAZOLIN SODIUM-DEXTROSE 2-4 GM/100ML-% IV SOLN
2.0000 g | INTRAVENOUS | Status: AC
  Administered 2022-08-22: 2 g via INTRAVENOUS

## 2022-08-22 MED ORDER — CHLORHEXIDINE GLUCONATE 4 % EX LIQD: 60.0000 mL | Freq: Once | CUTANEOUS | Status: DC

## 2022-08-22 MED ORDER — DIAZEPAM 5 MG/ML IJ SOLN
2.5000 mg | Freq: Once | INTRAMUSCULAR | Status: AC
  Administered 2022-08-22: 2.5 mg via INTRAVENOUS
  Filled 2022-08-22: qty 2

## 2022-08-22 MED ORDER — HYDROCODONE-ACETAMINOPHEN 5-325 MG PO TABS
1.0000 | ORAL_TABLET | ORAL | Status: DC | PRN
  Administered 2022-08-22 – 2022-08-23 (×4): 2 via ORAL
  Filled 2022-08-22 (×4): qty 2

## 2022-08-22 MED ORDER — OXYCODONE HCL 5 MG PO TABS: 5.0000 mg | ORAL_TABLET | ORAL | Status: DC | PRN

## 2022-08-22 MED ORDER — METHOCARBAMOL 500 MG PO TABS
500.0000 mg | ORAL_TABLET | Freq: Three times a day (TID) | ORAL | Status: DC | PRN
  Administered 2022-08-22 – 2022-08-25 (×7): 500 mg via ORAL
  Filled 2022-08-22 (×8): qty 1

## 2022-08-22 MED ORDER — FENTANYL CITRATE (PF) 100 MCG/2ML IJ SOLN
25.0000 ug | INTRAMUSCULAR | Status: DC | PRN
  Administered 2022-08-22: 50 ug via INTRAVENOUS

## 2022-08-22 MED ORDER — TRANEXAMIC ACID-NACL 1000-0.7 MG/100ML-% IV SOLN
1000.0000 mg | INTRAVENOUS | Status: AC
  Administered 2022-08-22: 1000 mg via INTRAVENOUS

## 2022-08-22 MED ORDER — PROPOFOL 10 MG/ML IV BOLUS
INTRAVENOUS | Status: DC | PRN
  Administered 2022-08-22: 30 mg via INTRAVENOUS

## 2022-08-22 MED ORDER — HYDROMORPHONE HCL 1 MG/ML IJ SOLN
1.0000 mg | Freq: Once | INTRAMUSCULAR | Status: AC
  Administered 2022-08-22: 1 mg via INTRAVENOUS
  Filled 2022-08-22: qty 1

## 2022-08-22 MED ORDER — PROPOFOL 500 MG/50ML IV EMUL
INTRAVENOUS | Status: DC | PRN
  Administered 2022-08-22: 75 ug/kg/min via INTRAVENOUS

## 2022-08-22 MED ORDER — SODIUM CHLORIDE (PF) 0.9 % IJ SOLN
INTRAMUSCULAR | Status: DC | PRN
  Administered 2022-08-22: 500 mL

## 2022-08-22 MED ORDER — DOCUSATE SODIUM 100 MG PO CAPS
100.0000 mg | ORAL_CAPSULE | Freq: Two times a day (BID) | ORAL | Status: DC
  Administered 2022-08-22 – 2022-08-25 (×5): 100 mg via ORAL
  Filled 2022-08-22 (×6): qty 1

## 2022-08-22 MED ORDER — POVIDONE-IODINE 10 % EX SWAB
2.0000 | Freq: Once | CUTANEOUS | Status: AC
  Administered 2022-08-22: 2 via TOPICAL

## 2022-08-22 MED ORDER — PHENYLEPHRINE HCL (PRESSORS) 10 MG/ML IV SOLN
INTRAVENOUS | Status: DC | PRN
  Administered 2022-08-22: 160 ug via INTRAVENOUS

## 2022-08-22 MED ORDER — ACETAMINOPHEN 325 MG PO TABS
650.0000 mg | ORAL_TABLET | Freq: Four times a day (QID) | ORAL | Status: DC | PRN
  Administered 2022-08-24 – 2022-08-25 (×2): 650 mg via ORAL
  Filled 2022-08-22 (×2): qty 2

## 2022-08-22 MED ORDER — MIDAZOLAM HCL 2 MG/2ML IJ SOLN: 2.0000 mg | Freq: Once | INTRAMUSCULAR | Status: AC

## 2022-08-22 MED ORDER — CEFAZOLIN SODIUM-DEXTROSE 2-4 GM/100ML-% IV SOLN
INTRAVENOUS | Status: AC
  Filled 2022-08-22: qty 100

## 2022-08-22 MED ORDER — SODIUM CHLORIDE (PF) 0.9 % IJ SOLN
INTRAMUSCULAR | Status: DC | PRN
  Administered 2022-08-22: 60 mL

## 2022-08-22 MED ORDER — ONDANSETRON HCL 4 MG/2ML IJ SOLN
4.0000 mg | Freq: Four times a day (QID) | INTRAMUSCULAR | Status: DC | PRN
  Administered 2022-08-22: 4 mg via INTRAVENOUS
  Filled 2022-08-22 (×2): qty 2

## 2022-08-22 MED ORDER — IPRATROPIUM-ALBUTEROL 0.5-2.5 (3) MG/3ML IN SOLN: 3.0000 mL | RESPIRATORY_TRACT | Status: DC | PRN

## 2022-08-22 MED ORDER — CEFAZOLIN SODIUM-DEXTROSE 2-4 GM/100ML-% IV SOLN
2.0000 g | Freq: Three times a day (TID) | INTRAVENOUS | Status: AC
  Administered 2022-08-22 – 2022-08-23 (×2): 2 g via INTRAVENOUS
  Filled 2022-08-22 (×2): qty 100

## 2022-08-22 MED ORDER — GUAIFENESIN 100 MG/5ML PO LIQD: 5.0000 mL | ORAL | Status: DC | PRN

## 2022-08-22 MED ORDER — METOPROLOL TARTRATE 5 MG/5ML IV SOLN: 5.0000 mg | INTRAVENOUS | Status: DC | PRN

## 2022-08-22 MED ORDER — SODIUM CHLORIDE 0.9 % IR SOLN
Status: DC | PRN
  Administered 2022-08-22: 2000 mL

## 2022-08-22 MED ORDER — ORAL CARE MOUTH RINSE: 15.0000 mL | Freq: Once | OROMUCOSAL | Status: AC

## 2022-08-22 MED ORDER — TRANEXAMIC ACID-NACL 1000-0.7 MG/100ML-% IV SOLN
INTRAVENOUS | Status: AC
  Filled 2022-08-22: qty 100

## 2022-08-22 MED ORDER — ASPIRIN 81 MG PO TBEC
81.0000 mg | DELAYED_RELEASE_TABLET | Freq: Two times a day (BID) | ORAL | Status: DC
  Administered 2022-08-23 – 2022-08-25 (×5): 81 mg via ORAL
  Filled 2022-08-22 (×5): qty 1

## 2022-08-22 MED ORDER — MORPHINE SULFATE (PF) 2 MG/ML IV SOLN: 2.0000 mg | INTRAVENOUS | Status: DC | PRN

## 2022-08-22 MED ORDER — MIDAZOLAM HCL 2 MG/2ML IJ SOLN
INTRAMUSCULAR | Status: AC
  Administered 2022-08-22: 2 mg via INTRAVENOUS
  Filled 2022-08-22: qty 2

## 2022-08-22 MED ORDER — TRAZODONE HCL 50 MG PO TABS
50.0000 mg | ORAL_TABLET | Freq: Every evening | ORAL | Status: DC | PRN
  Administered 2022-08-22 – 2022-08-24 (×3): 50 mg via ORAL
  Filled 2022-08-22 (×3): qty 1

## 2022-08-22 MED ORDER — FENTANYL CITRATE (PF) 100 MCG/2ML IJ SOLN
INTRAMUSCULAR | Status: AC
  Filled 2022-08-22: qty 2

## 2022-08-22 MED ORDER — 0.9 % SODIUM CHLORIDE (POUR BTL) OPTIME
TOPICAL | Status: DC | PRN
  Administered 2022-08-22: 1000 mL

## 2022-08-22 MED ORDER — HYDROMORPHONE HCL 1 MG/ML IJ SOLN
0.5000 mg | INTRAMUSCULAR | Status: DC | PRN
  Administered 2022-08-22: 0.5 mg via INTRAVENOUS
  Filled 2022-08-22: qty 0.5

## 2022-08-22 MED ORDER — BUPIVACAINE LIPOSOME 1.3 % IJ SUSP
INTRAMUSCULAR | Status: AC
  Filled 2022-08-22: qty 20

## 2022-08-22 MED ORDER — HYDRALAZINE HCL 20 MG/ML IJ SOLN: 10.0000 mg | INTRAMUSCULAR | Status: DC | PRN

## 2022-08-22 MED ORDER — FENTANYL CITRATE (PF) 100 MCG/2ML IJ SOLN
INTRAMUSCULAR | Status: DC | PRN
Start: 2022-08-22 — End: 2022-08-22
  Administered 2022-08-22: 50 ug via INTRAVENOUS
  Administered 2022-08-22: 75 ug via INTRAVENOUS
  Administered 2022-08-22: 25 ug via INTRAVENOUS
  Administered 2022-08-22 (×2): 50 ug via INTRAVENOUS

## 2022-08-22 MED ORDER — MIDAZOLAM HCL 2 MG/2ML IJ SOLN
INTRAMUSCULAR | Status: AC
Start: 2022-08-22 — End: ?
  Filled 2022-08-22: qty 2

## 2022-08-22 MED ORDER — HYDROCODONE-ACETAMINOPHEN 5-325 MG PO TABS: 1.0000 | ORAL_TABLET | Freq: Four times a day (QID) | ORAL | Status: DC | PRN

## 2022-08-22 MED ORDER — MUPIROCIN 2 % EX OINT: 1.0000 | TOPICAL_OINTMENT | Freq: Two times a day (BID) | CUTANEOUS | Status: DC

## 2022-08-22 MED ORDER — HYDROMORPHONE HCL 1 MG/ML IJ SOLN
0.5000 mg | INTRAMUSCULAR | Status: DC | PRN
  Administered 2022-08-22 – 2022-08-23 (×10): 1 mg via INTRAVENOUS
  Administered 2022-08-24: 0.5 mg via INTRAVENOUS
  Administered 2022-08-24: 1 mg via INTRAVENOUS
  Filled 2022-08-22 (×13): qty 1

## 2022-08-22 MED ORDER — BUPIVACAINE IN DEXTROSE 0.75-8.25 % IT SOLN
INTRATHECAL | Status: DC | PRN
  Administered 2022-08-22: 1.6 mL via INTRATHECAL

## 2022-08-22 MED ORDER — HYDROMORPHONE HCL 1 MG/ML IJ SOLN
0.5000 mg | Freq: Once | INTRAMUSCULAR | Status: AC
  Administered 2022-08-22: 0.5 mg via INTRAVENOUS
  Filled 2022-08-22: qty 0.5

## 2022-08-22 MED ORDER — ROPIVACAINE HCL 5 MG/ML IJ SOLN
INTRAMUSCULAR | Status: DC | PRN
  Administered 2022-08-22: 25 mL via PERINEURAL

## 2022-08-22 MED ORDER — FENTANYL CITRATE (PF) 250 MCG/5ML IJ SOLN
INTRAMUSCULAR | Status: AC
  Filled 2022-08-22: qty 5

## 2022-08-22 MED ORDER — FENTANYL CITRATE (PF) 100 MCG/2ML IJ SOLN
INTRAMUSCULAR | Status: AC
  Administered 2022-08-22: 50 ug via INTRAVENOUS
  Filled 2022-08-22: qty 2

## 2022-08-22 MED ORDER — FENTANYL CITRATE (PF) 100 MCG/2ML IJ SOLN: 50.0000 ug | Freq: Once | INTRAMUSCULAR | Status: AC

## 2022-08-22 MED ORDER — SENNOSIDES-DOCUSATE SODIUM 8.6-50 MG PO TABS: 1.0000 | ORAL_TABLET | Freq: Every evening | ORAL | Status: DC | PRN

## 2022-08-22 MED ORDER — MORPHINE SULFATE (PF) 2 MG/ML IV SOLN
0.5000 mg | INTRAVENOUS | Status: DC | PRN
  Filled 2022-08-22: qty 1

## 2022-08-22 MED ORDER — MIDAZOLAM HCL 5 MG/5ML IJ SOLN
INTRAMUSCULAR | Status: DC | PRN
  Administered 2022-08-22 (×2): 1 mg via INTRAVENOUS

## 2022-08-22 MED ORDER — CHLORHEXIDINE GLUCONATE 0.12 % MT SOLN
OROMUCOSAL | Status: AC
  Administered 2022-08-22: 15 mL via OROMUCOSAL
  Filled 2022-08-22: qty 15

## 2022-08-22 MED ORDER — PHENYLEPHRINE HCL-NACL 20-0.9 MG/250ML-% IV SOLN
INTRAVENOUS | Status: DC | PRN
  Administered 2022-08-22: 50 ug/min via INTRAVENOUS

## 2022-08-22 MED ORDER — CHLORHEXIDINE GLUCONATE 0.12 % MT SOLN: 15.0000 mL | Freq: Once | OROMUCOSAL | Status: AC

## 2022-08-22 SURGICAL SUPPLY — 59 items
BLADE SAGITTAL 25.0X1.27X90 (BLADE) ×1 IMPLANT
BRUSH FEMORAL CANAL (MISCELLANEOUS) IMPLANT
CHLORAPREP W/TINT 26 (MISCELLANEOUS) ×2 IMPLANT
COVER SURGICAL LIGHT HANDLE (MISCELLANEOUS) ×1 IMPLANT
DERMABOND IMPLANT
DRAPE HALF SHEET 40X57 (DRAPES) ×1 IMPLANT
DRAPE HIP W/POCKET STRL (MISCELLANEOUS) ×1 IMPLANT
DRAPE INCISE IOBAN 66X45 STRL (DRAPES) ×1 IMPLANT
DRAPE INCISE IOBAN 85X60 (DRAPES) ×2 IMPLANT
DRAPE POUCH INSTRU U-SHP 10X18 (DRAPES) ×1 IMPLANT
DRAPE U-SHAPE 47X51 STRL (DRAPES) ×2 IMPLANT
DRSG AQUACEL AG ADV 3.5X10 (GAUZE/BANDAGES/DRESSINGS) ×1 IMPLANT
ELECT BLADE 4.0 EZ CLEAN MEGAD (MISCELLANEOUS) ×1
ELECTRODE BLDE 4.0 EZ CLN MEGD (MISCELLANEOUS) ×1 IMPLANT
GLOVE SRG 8 PF TXTR STRL LF DI (GLOVE) ×1 IMPLANT
GLOVE SURG ORTHO 8.0 STRL STRW (GLOVE) ×2 IMPLANT
GLOVE SURG UNDER POLY LF SZ8 (GLOVE) ×1
GOWN STRL REUS W/ TWL LRG LVL3 (GOWN DISPOSABLE) ×2 IMPLANT
GOWN STRL REUS W/ TWL XL LVL3 (GOWN DISPOSABLE) ×1 IMPLANT
GOWN STRL REUS W/TWL LRG LVL3 (GOWN DISPOSABLE) ×2
GOWN STRL REUS W/TWL XL LVL3 (GOWN DISPOSABLE) ×1
HANDPIECE INTERPULSE COAX TIP (DISPOSABLE) ×1
HEAD CERAMIC FEMORAL 36MM (Head) IMPLANT
HOOD PEEL AWAY FLYTE STAYCOOL (MISCELLANEOUS) ×3 IMPLANT
INSERT 0 DEGREE 36 (Miscellaneous) IMPLANT
IRRIGATION SURGIPHOR STRL (IV SOLUTION) ×1 IMPLANT
KIT BASIN OR (CUSTOM PROCEDURE TRAY) ×1 IMPLANT
KIT TURNOVER KIT A (KITS) ×1 IMPLANT
MANIFOLD NEPTUNE II (INSTRUMENTS) ×1 IMPLANT
MARKER SKIN DUAL TIP RULER LAB (MISCELLANEOUS) ×1 IMPLANT
NDL 18GX1X1/2 (RX/OR ONLY) (NEEDLE) ×1 IMPLANT
NEEDLE 18GX1X1/2 (RX/OR ONLY) (NEEDLE) IMPLANT
NS IRRIG 1000ML POUR BTL (IV SOLUTION) ×1 IMPLANT
PACK TOTAL JOINT (CUSTOM PROCEDURE TRAY) ×1 IMPLANT
PRESSURIZER FEMORAL UNIV (MISCELLANEOUS) IMPLANT
RETRIEVER SUT HEWSON (MISCELLANEOUS) ×1 IMPLANT
SCREW HEX LP 6.5X15 (Screw) IMPLANT
SCREW HEX LP 6.5X35 (Screw) IMPLANT
SEALER BIPOLAR AQUA 6.0 (INSTRUMENTS) ×1 IMPLANT
SET HNDPC FAN SPRY TIP SCT (DISPOSABLE) ×1 IMPLANT
SHELL ACETAB TRIDENT 48 (Shell) IMPLANT
SPONGE T-LAP 18X18 ~~LOC~~+RFID (SPONGE) ×2 IMPLANT
STEM HIP 127 DEG (Stem) IMPLANT
SUCTION FRAZIER HANDLE 10FR (MISCELLANEOUS) ×1
SUCTION TUBE FRAZIER 10FR DISP (MISCELLANEOUS) ×1 IMPLANT
SUT BONE WAX W31G (SUTURE) ×1 IMPLANT
SUT ETHIBOND 2 V 37 (SUTURE) ×1 IMPLANT
SUT MNCRL AB 3-0 PS2 18 (SUTURE) ×1 IMPLANT
SUT STRATAFIX 1PDS 45CM VIOLET (SUTURE) ×2 IMPLANT
SUT VIC AB 0 CT1 27 (SUTURE) ×1
SUT VIC AB 0 CT1 27XBRD ANBCTR (SUTURE) ×1 IMPLANT
SUT VIC AB 2-0 CT2 27 (SUTURE) ×2 IMPLANT
SYR 20ML LL LF (SYRINGE) ×1 IMPLANT
SYR 50ML LL SCALE MARK (SYRINGE) ×1 IMPLANT
TOWEL GREEN STERILE (TOWEL DISPOSABLE) ×1 IMPLANT
TOWER CARTRIDGE SMART MIX (DISPOSABLE) IMPLANT
TRAY FOLEY MTR SLVR 16FR STAT (SET/KITS/TRAYS/PACK) ×1 IMPLANT
TUBE SUCT ARGYLE STRL (TUBING) ×1 IMPLANT
WATER STERILE IRR 1000ML POUR (IV SOLUTION) ×1 IMPLANT

## 2022-08-22 NOTE — Progress Notes (Signed)
OT Cancellation Note  Patient Details Name: Madison Malone MRN: [id number] DOB: 1966-10-30   Cancelled Treatment:    Reason Eval/Treat Not Completed: Active bedrest order (pt with femur fx awaiting sx)  Lorre Munroe 08/22/2022, 11:28 AM

## 2022-08-22 NOTE — Plan of Care (Signed)

## 2022-08-22 NOTE — ED Notes (Signed)
Report given to [facility] at [facility]

## 2022-08-22 NOTE — Progress Notes (Signed)
Patient discussed with EDP.  Imaging reviewed.  Displaced R femoral neck fx.  Agree that with pt's age likely would benefit from THA.  No provider at [facility] available for THA thus requesting transfer.  Will try to find someone here who can perform THA.  Admit to medicine, preop clearance/optimization/risk stratification, NPO for possible OR, NWB RLE, pain control.  Full consult to follow.  Ernestina Columbia M.D. Orthopaedic Surgery [facility]

## 2022-08-22 NOTE — Discharge Instructions (Signed)

## 2022-08-22 NOTE — Transfer of Care (Signed)
Immediate Anesthesia Transfer of Care Note  Patient: Cherie Dark  Procedure(s) Performed: TOTAL HIP ARTHROPLASTY (Right: Hip)  Patient Location: PACU  Anesthesia Type:MAC and Spinal  Level of Consciousness: drowsy and patient cooperative  Airway & Oxygen Therapy: Patient Spontanous Breathing  Post-op Assessment: Report given to RN and Post -op Vital signs reviewed and stable  Post vital signs: Reviewed and stable  Last Vitals:  Vitals Value Taken Time  BP 133/87 08/22/22 1631  Temp    Pulse 103 08/22/22 1636  Resp 11 08/22/22 1636  SpO2 95 % 08/22/22 1636  Vitals shown include unvalidated device data.  Last Pain:  Vitals:   08/22/22 1325  TempSrc:   PainSc: 10-Worst pain ever         Complications: No notable events documented.

## 2022-08-22 NOTE — ED Notes (Signed)
Pt brought to ED 6, this RN now assuming care of pt.

## 2022-08-22 NOTE — Anesthesia Postprocedure Evaluation (Signed)
Anesthesia Post Note  Patient: Madison Malone  Procedure(s) Performed: TOTAL HIP ARTHROPLASTY (Right: Hip)     Anesthesia Type: Spinal Anesthetic complications: no   No notable events documented.  Last Vitals:  Vitals:   08/22/22 1700 08/22/22 1715  BP: 127/76 (!) 156/79  Pulse: (!) 104 94  Resp: (!) 24 14  Temp:    SpO2: 94% 92%    Last Pain:  Vitals:   08/22/22 1700  TempSrc:   PainSc: 7                  Rasheem Figiel,W. EDMOND

## 2022-08-22 NOTE — Interval H&P Note (Signed)
The patient has been re-examined, and the chart reviewed, and there have been no interval changes to the documented history and physical.    Plan for R THA for femoral neck fracture  The operative side was examined and the patient was confirmed to have sensation to DPN, SPN, TN intact, Motor EHL, ext, flex 5/5, and DP 2+, PT 2+, No significant edema.   The risks, benefits, and alternatives have been discussed at length with patient, and the patient is willing to proceed.  Right hip marked. Consent has been signed.  

## 2022-08-22 NOTE — Progress Notes (Signed)
PROGRESS NOTE    Teneil Shiller  DUK:[id number] DOB: November 04, 1966 DOA: 08/22/2022 PCP: Lauro Regulus, MD   Brief Narrative:  56 y.o. female with medical history significant of smoking, chronic low back pain, arthritis.  Apparently osteoporosis per PCP records. Pt presents to ED at [facility] with c/o R hip pain following mechanical fall.  Tripped over her dog's leash, fell on R hip.  Patient transferred here per orthopedic request for further management due to lack of service there   Assessment & Plan:  Principal Problem:   Closed displaced fracture of right femoral neck (HCC) Active Problems:   Osteoporosis     Assessment and Plan: * Closed displaced fracture of right femoral neck (HCC) Admit patient to the hospital.  Transferred here from [facility] per orthopedic request for surgical management.  Patient is already on medically stable and optimized for surgery at this time.  Postop PT/OT.  Pain control, bowel regimen  Osteoporosis Supportive care   DVT prophylaxis: Postop DVT prophylaxis per orthopedic Code Status: Full Family Communication:    Admitted for right hip fracture   Subjective: Seen and examined at bedside.  Reporting of right lower extremity pain as expected but no other complaints at this time.    Examination:  General exam: Appears calm and comfortable  Respiratory system: Clear to auscultation. Respiratory effort normal. Cardiovascular system: S1 & S2 heard, RRR. No JVD, murmurs, rubs, gallops or clicks. No pedal edema. Gastrointestinal system: Abdomen is nondistended, soft and nontender. No organomegaly or masses felt. Normal bowel sounds heard. Central nervous system: Alert and oriented. No focal neurological deficits. Extremities: Lower.  Range of mobility/range of motion of right lower extremity Skin: No rashes, lesions or ulcers Psychiatry: Judgement and insight appear normal. Mood & affect appropriate.     Objective: Vitals:   08/22/22 0625   Weight: 49 kg  Height: 5\' 6"  (1.676 m)   No intake or output data in the 24 hours ending 08/22/22 0703 Filed Weights   08/22/22 0625  Weight: 49 kg     Data Reviewed:   CBC: Recent Labs  Lab 08/22/22 0015  WBC 7.7  NEUTROABS 6.2  HGB 12.8  HCT 38.0  MCV 104.1*  PLT 256   Basic Metabolic Panel: Recent Labs  Lab 08/22/22 0015  NA 136  K 3.4*  CL 104  CO2 21*  GLUCOSE 95  BUN 16  CREATININE 0.60  CALCIUM 8.8*   GFR: Estimated Creatinine Clearance: 60.7 mL/min (by C-G formula based on SCr of 0.6 mg/dL). Liver Function Tests: Recent Labs  Lab 08/22/22 0015  AST 21  ALT 18  ALKPHOS 58  BILITOT <0.1*  PROT 7.2  ALBUMIN 4.1   No results for input(s): "LIPASE", "AMYLASE" in the last 168 hours. No results for input(s): "AMMONIA" in the last 168 hours. Coagulation Profile: Recent Labs  Lab 08/22/22 0015  INR 1.0   Cardiac Enzymes: No results for input(s): "CKTOTAL", "CKMB", "CKMBINDEX", "TROPONINI" in the last 168 hours. BNP (last 3 results) No results for input(s): "PROBNP" in the last 8760 hours. HbA1C: No results for input(s): "HGBA1C" in the last 72 hours. CBG: No results for input(s): "GLUCAP" in the last 168 hours. Lipid Profile: No results for input(s): "CHOL", "HDL", "LDLCALC", "TRIG", "CHOLHDL", "LDLDIRECT" in the last 72 hours. Thyroid Function Tests: No results for input(s): "TSH", "T4TOTAL", "FREET4", "T3FREE", "THYROIDAB" in the last 72 hours. Anemia Panel: No results for input(s): "VITAMINB12", "FOLATE", "FERRITIN", "TIBC", "IRON", "RETICCTPCT" in the last 72 hours. Sepsis Labs: No  results for input(s): "PROCALCITON", "LATICACIDVEN" in the last 168 hours.  No results found for this or any previous visit (from the past 240 hour(s)).       Radiology Studies: DG Chest 1 View  Result Date: 08/21/2022 CLINICAL DATA:  Post fall with right hip injury. EXAM: CHEST  1 VIEW COMPARISON:  None Available. FINDINGS: The cardiomediastinal  contours are normal. The lungs are clear. Pulmonary vasculature is normal. No consolidation, pleural effusion, or pneumothorax. No acute osseous abnormalities are seen. IMPRESSION: No acute chest findings. Electronically Signed   By: Narda Rutherford M.D.   On: 08/21/2022 22:32   DG Hip Unilat  With Pelvis 2-3 Views Right  Result Date: 08/21/2022 CLINICAL DATA:  Post fall with right hip injury. EXAM: DG HIP (WITH OR WITHOUT PELVIS) 2-3V RIGHT COMPARISON:  None Available. FINDINGS: Displaced right femoral neck fracture. There is proximal migration of the femoral shaft. The femoral head remains seated. The pubic rami are intact. No additional fracture of the pelvis. The pubic symphysis and sacroiliac joints are congruent. IMPRESSION: Displaced right femoral neck fracture. Electronically Signed   By: Narda Rutherford M.D.   On: 08/21/2022 22:31        Scheduled Meds:  Continuous Infusions:  lactated ringers       LOS: 0 days   Time spent= 35 mins    Sherriann Szuch Joline Maxcy, MD [facility]  If 7PM-7AM, please contact night-coverage  08/22/2022, 7:03 AM

## 2022-08-22 NOTE — Anesthesia Procedure Notes (Signed)
Spinal  Patient location during procedure: OR Start time: 08/22/2022 2:01 PM End time: 08/22/2022 2:06 PM Reason for block: surgical anesthesia Staffing Performed: anesthesiologist  Anesthesiologist: Gaynelle Adu, MD Performed by: Gaynelle Adu, MD Authorized by: Gaynelle Adu, MD   Preanesthetic Checklist Completed: patient identified, IV checked, risks and benefits discussed, surgical consent, monitors and equipment checked, pre-op evaluation and timeout performed Spinal Block Patient position: left lateral decubitus Prep: DuraPrep Patient monitoring: cardiac monitor, continuous pulse ox and blood pressure Approach: midline Location: L3-4 Injection technique: single-shot Needle Needle type: Quincke  Needle gauge: 22 G Needle length: 9 cm Assessment Sensory level: T8 Events: CSF return Additional Notes Functioning IV was confirmed and monitors were applied. Sterile prep and drape, including hand hygiene and sterile gloves were used. The patient was positioned and the spine was prepped. The skin was anesthetized with lidocaine.  Free flow of clear CSF was obtained prior to injecting local anesthetic into the CSF.  The spinal needle aspirated freely following injection.  The needle was carefully withdrawn.  The patient tolerated the procedure well.

## 2022-08-22 NOTE — Anesthesia Preprocedure Evaluation (Addendum)
Anesthesia Evaluation  Patient identified by MRN, date of birth, ID band Patient awake    Reviewed: Allergy & Precautions, H&P , NPO status , Patient's Chart, lab work & pertinent test results  Airway Mallampati: II  TM Distance: >3 FB Neck ROM: Full    Dental no notable dental hx. (+) Teeth Intact, Dental Advisory Given   Pulmonary neg pulmonary ROS, Current Smoker and Patient abstained from smoking.,    Pulmonary exam normal breath sounds clear to auscultation       Cardiovascular negative cardio ROS   Rhythm:Regular Rate:Normal     Neuro/Psych negative neurological ROS  negative psych ROS   GI/Hepatic negative GI ROS, Neg liver ROS,   Endo/Other  negative endocrine ROS  Renal/GU negative Renal ROS  negative genitourinary   Musculoskeletal  (+) Arthritis , Osteoarthritis,    Abdominal   Peds  Hematology negative hematology ROS (+)   Anesthesia Other Findings   Reproductive/Obstetrics negative OB ROS                            Anesthesia Physical Anesthesia Plan  ASA: 2  Anesthesia Plan: Spinal   Post-op Pain Management: Tylenol PO (pre-op)* and Regional block*   Induction: Intravenous  PONV Risk Score and Plan: 2 and Propofol infusion and Ondansetron  Airway Management Planned: Natural Airway and Simple Face Mask  Additional Equipment:   Intra-op Plan:   Post-operative Plan:   Informed Consent: I have reviewed the patients History and Physical, chart, labs and discussed the procedure including the risks, benefits and alternatives for the proposed anesthesia with the patient or authorized representative who has indicated his/her understanding and acceptance.     Dental advisory given  Plan Discussed with: CRNA  Anesthesia Plan Comments:         Anesthesia Quick Evaluation

## 2022-08-22 NOTE — Plan of Care (Signed)
  Problem: Education: Goal: Knowledge of General Education information will improve Description: Including pain rating scale, medication(s)/side effects and non-pharmacologic comfort measures Outcome: Progressing   Problem: Coping: Goal: Level of anxiety will decrease Outcome: Progressing   Problem: Safety: Goal: Ability to remain free from injury will improve Outcome: Progressing   

## 2022-08-22 NOTE — Progress Notes (Signed)
PT Cancellation Note  Patient Details Name: Madison Malone MRN: [id number] DOB: [birth year]   Cancelled Treatment:    Reason Eval/Treat Not Completed: Active bedrest order (pt with femur fx awaiting sx)   [name] [name] 08/22/2022, 10:37 AM [name], PT Acute Rehabilitation Services Office: [phone]

## 2022-08-22 NOTE — Consult Note (Signed)
ORTHOPAEDIC CONSULTATION  REQUESTING PHYSICIAN: Amin, Ankit Chirag, MD  Chief Complaint: R hip fracture  HPI: Madison Malone is a 56 y.o. female who complains of tripped over a dog leash last night landing onto her right hip.  She localizes pain to the right hip and right knee.  She was brought by EMS to [place] where they found that she has a displaced femoral neck fracture.  She has a complex history of patellar instability and has had multiple knee operations most recently a total knee replacement that was done in [place] and took 3 hours.  She states this was done 6 years ago.  She reports that she never did well after the knee replacement, and has had chronic pain and issues with her knee ever since.  She does report that the fall seems as exacerbated pain in her right knee.  She denies distal numbness and tingling.  She denies pain in other joints or extremities.  Past Medical History:  Diagnosis Date   Arthritis    Chronic low back pain    GERD (gastroesophageal reflux disease)    Osteoporosis    Age 25 apparently per PCP records   Past Surgical History:  Procedure Laterality Date   multiple RIGHT knee surgeries     Social History   Socioeconomic History   Marital status: Single    Spouse name: Not on file   Number of children: Not on file   Years of education: Not on file   Highest education level: Not on file  Occupational History   Not on file  Tobacco Use   Smoking status: Every Day    Packs/day: 1.00    Types: Cigarettes   Smokeless tobacco: Never  Vaping Use   Vaping Use: Never used  Substance and Sexual Activity   Alcohol use: No   Drug use: Not Currently   Sexual activity: Not Currently  Other Topics Concern   Not on file  Social History Narrative   Not on file   Social Determinants of Health   Financial Resource Strain: Not on file  Food Insecurity: Not on file  Transportation Needs: Not on file  Physical Activity: Not on file  Stress: Not  on file  Social Connections: Not on file   Family History  Problem Relation Age of Onset   Hypertension Mother    Osteoarthritis Mother    Anxiety disorder Mother    Kidney failure Father    Alcohol abuse Sister    No Known Allergies   Positive ROS: All other systems have been reviewed and were otherwise negative with the exception of those mentioned in the HPI and as above.  Physical Exam: General: Alert, no acute distress Cardiovascular: No pedal edema Respiratory: No cyanosis, no use of accessory musculature Skin: No lesions in the area of chief complaint Neurologic: Sensation intact distally Psychiatric: Patient is competent for consent with normal mood and affect  MUSCULOSKELETAL:  LLE No traumatic wounds, ecchymosis, or rash  Nontender  No groin pain with log roll  No knee or ankle effusion  Sens DPN, SPN, TN intact  Motor EHL, ext, flex 5/5  DP 2+, PT 2+, No significant edema  RLE No traumatic wounds, ecchymosis, or rash  Tender about knee and hip  Well healed extensive anterior R knee incision  Mild swelling about the ankle likely chronic  No ankle effusion  Sens DPN, SPN, TN intact  Motor EHL, ext, flex 5/5  DP 2+, PT 2+, No significant edema     IMAGING: X-rays right hip demonstrate displaced neck fracture.  Assessment: Principal Problem:   Closed displaced fracture of right femoral neck (HCC) Active Problems:   Osteoporosis   Right displaced femoral neck fracture  Plan: Plan discussed with the patient today that she has a displaced femoral neck fracture would ultimately benefit from arthroplasty.  Discussed merits of Hemi versus total hip arthroplasty.  Given her young age and high activity level feel she would have better function and longevity with a full hip replacement.  Also discussed plan for press-fit versus cemented hip replacement.  Overall her cortices look reasonable on her x-ray today and given her young age again would lean towards a  press-fit stem however given her fracture will have cemented stem available if concern Intra-Op for poor bone quality.  The risks benefits and alternatives were discussed with the patient including but not limited to the risks of nonoperative treatment, versus surgical intervention including infection, bleeding, nerve injury, periprosthetic fracture, the need for revision surgery, dislocation, leg length discrepancy, blood clots, cardiopulmonary complications, morbidity, mortality, among others, and they were willing to proceed.     Patient has not voided yet since the surgery.  Feels she is unable to get on bedpan..  Ordered a Foley catheter to be placed.  Patient is n.p.o., okay for ice chips.  Plan for OR today later this afternoon versus tomorrow morning pending OR availability.  We will update patient and diet order if not able to proceed with surgery today.  X-ray right knee ordered given acute on chronic knee pain after fall and history of complex right knee replacement.    Joen Laura, MD  Contact information:   [id number] 7am-5pm epic message Dr. Blanchie Dessert, or call office for patient follow up: [phone] After hours and holidays please check [email or web address] for group call information for [facility]

## 2022-08-22 NOTE — Assessment & Plan Note (Addendum)
1. Hip fx pathway 2. NPO 3. Plan per ortho = needs THA 4. Pain control including IV narcotics per pathway 5. CXR neg 6. EKG pending 7. But otherwise, pt in fairly decent health and quite young for procedure compared to most hip fx repair patients.  GUPTA score = 0.1% 1. See discussion on osteoporosis below. 8. 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  Wants to have long discussion with ortho surgery about risks, benefits, and alternatives to procedure.

## 2022-08-22 NOTE — Assessment & Plan Note (Addendum)
Pt apparently with osteoporosis despite young age.  Per PCPs record has had osteoporosis since age 56. Not sure why this is the case... I will say very young onset osteoporosis is the one thing that really stands out as unusual about her history to me though.

## 2022-08-22 NOTE — H&P (Addendum)
History and Physical    Patient: Madison Malone PZW:[id number] DOB: December 11, 1966 DOA: 08/22/2022 DOS: the patient was seen and examined on 08/22/2022 PCP: Lauro Regulus, MD  Patient coming from: Home  Chief Complaint: Hip fx HPI: Madison Malone is a 56 y.o. female with medical history significant of smoking, chronic low back pain, arthritis.  Apparently osteoporosis per PCP records.  Pt presents to ED at [facility] with c/o R hip pain following mechanical fall.  Tripped over her dog's leash, fell on R hip.  No: neck pain, CP, SOB, abd pain, didn't hit head, no LOC.  Not on AC.  Review of Systems: As mentioned in the history of present illness. All other systems reviewed and are negative. Past Medical History:  Diagnosis Date   Arthritis    Chronic low back pain    GERD (gastroesophageal reflux disease)    Osteoporosis    Age 25 apparently per PCP records   Past Surgical History:  Procedure Laterality Date   multiple RIGHT knee surgeries     Social History:  reports that she has been smoking. She has been smoking an average of 1 pack per day. She has never used smokeless tobacco. She reports that she does not currently use drugs. She reports that she does not drink alcohol.  No Known Allergies  Family History  Problem Relation Age of Onset   Hypertension Mother    Osteoarthritis Mother    Anxiety disorder Mother    Kidney failure Father    Alcohol abuse Sister     Prior to Admission medications   Medication Sig Start Date End Date Taking? Authorizing Provider  cyclobenzaprine (FLEXERIL) 10 MG tablet Take 10 mg by mouth 3 (three) times daily as needed for muscle spasms.    [provider]    Physical Exam: There were no vitals filed for this visit. Constitutional: NAD, calm, uncomfortable, mostly anxious about surgery and situation Eyes: PERRL, lids and conjunctivae normal ENMT: Mucous membranes are moist. Posterior pharynx clear of any exudate or  lesions.Normal dentition.  Neck: normal, supple, no masses, no thyromegaly Respiratory: clear to auscultation bilaterally, no wheezing, no crackles. Normal respiratory effort. No accessory muscle use.  Cardiovascular: Regular rate and rhythm, no murmurs / rubs / gallops. No extremity edema. 2+ pedal pulses. No carotid bruits.  Abdomen: no tenderness, no masses palpated. No hepatosplenomegaly. Bowel sounds positive.  Musculoskeletal: RLE shortened and externally rotated, painful hip Skin: no rashes, lesions, ulcers. No induration Neurologic: CN 2-12 grossly intact. Sensation intact, DTR normal. Strength 5/5 in all 4.  Psychiatric: Normal judgment and insight. Alert and oriented x 3. Anxious mood.   Data Reviewed:    X ray demonstrates R femoral neck fx with displacement.  Assessment and Plan: * Closed displaced fracture of right femoral neck (HCC) Hip fx pathway NPO Plan per ortho = needs THA Pain control including IV narcotics per pathway CXR neg EKG pending But otherwise, pt in fairly decent health and quite young for procedure compared to most hip fx repair patients.  GUPTA score = 0.1% See discussion on osteoporosis below. 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  Wants to have long discussion with ortho surgery about risks, benefits, and alternatives to procedure.  Osteoporosis Pt apparently with osteoporosis despite young age.  Per PCPs record has had osteoporosis since age 15. Not sure why this is the case... I will say very young onset osteoporosis is the one thing that  really stands out as unusual about her history to me though.      Advance Care Planning:   Code Status: Full Code  Consults: Dr. Sherilyn Dacosta  Family Communication: No family in room  Severity of Illness: The appropriate patient status for this patient is INPATIENT. Inpatient status is judged to be reasonable and necessary in order to provide the  required intensity of service to ensure the patient's safety. The patient's presenting symptoms, physical exam findings, and initial radiographic and laboratory data in the context of their chronic comorbidities is felt to place them at high risk for further clinical deterioration. Furthermore, it is not anticipated that the patient will be medically stable for discharge from the hospital within 2 midnights of admission.   * I certify that at the point of admission it is my clinical judgment that the patient will require inpatient hospital care spanning beyond 2 midnights from the point of admission due to high intensity of service, high risk for further deterioration and high frequency of surveillance required.*  Author: Hillary Bow., DO 08/22/2022 5:48 AM  For on call review [email or web address].

## 2022-08-22 NOTE — ED Notes (Signed)
Called [facility] for consult and possible transfer per Dr. Dolores Frame

## 2022-08-22 NOTE — ED Notes (Signed)
pt accepted to [facility] [address] [place] [facility] per [employer], Nurse, adult. Call report to [phone]. [facility] to transport

## 2022-08-22 NOTE — Anesthesia Procedure Notes (Signed)
Anesthesia Regional Block: Peng block   Pre-Anesthetic Checklist: , timeout performed,  Correct Patient, Correct Site, Correct Laterality,  Correct Procedure, Correct Position, site marked,  Risks and benefits discussed,  Pre-op evaluation,  At surgeon's request and post-op pain management  Laterality: Right  Prep: Maximum Sterile Barrier Precautions used, chloraprep       Needles:  Injection technique: Single-shot  Needle Type: Echogenic Stimulator Needle     Needle Length: 5cm  Needle Gauge: 22     Additional Needles:   Procedures:,,,, ultrasound used (permanent image in chart),,     Nerve Stimulator or Paresthesia:  Response: Patellar respose  Additional Responses:   Narrative:  Start time: 08/22/2022 12:56 PM End time: 08/22/2022 1:06 PM Injection made incrementally with aspirations every 5 mL.  Performed by: Personally  Anesthesiologist: Gaynelle Adu, MD

## 2022-08-22 NOTE — Op Note (Signed)
08/22/2022  3:51 PM  PATIENT:  Madison Malone   MRN: [id number]  PRE-OPERATIVE DIAGNOSIS: Right displaced femoral neck fracture  POST-OPERATIVE DIAGNOSIS:  same  PROCEDURE:  Procedure(s): Right total hip arthroplasty  PREOPERATIVE INDICATIONS:    Madison Malone is an 56 y.o. female who has a diagnosis of right displaced femoral neck fracture and elected for surgical management  .  The risks benefits and alternatives were discussed with the patient including but not limited to the risks of nonoperative treatment, versus surgical intervention including infection, bleeding, nerve injury, periprosthetic fracture, the need for revision surgery, dislocation, leg length discrepancy, blood clots, cardiopulmonary complications, morbidity, mortality, among others, and they were willing to proceed.     OPERATIVE REPORT     SURGEON:  Charlies Constable, MD    ASSISTANT: Nehemiah Massed, PA-C, (Present throughout the entire procedure,  necessary for completion of procedure in a timely manner, assisting with retraction, instrumentation, and closure)     ANESTHESIA: Spinal  ESTIMATED BLOOD LOSS: 923 cc    COMPLICATIONS:  None.     COMPONENTS:   Stryker Trident 248 mm acetabular shell, neutral liner, size 5 Accolade 2 with 127 degree neck angle, 36+0 ceramic head, 6.5 hex screws x2 Implant Name Type Inv. Item Serial No. Manufacturer Lot No. LRB No. Used Action  SCREW HEX LP 6.5X35 - [id number] Screw SCREW HEX LP 6.5X35  [employer] [id number] Right 1 Implanted  SCREW HEX LP 6.5X15 - [id number] Screw SCREW HEX LP 6.5X15  [employer] [id number] Right 1 Implanted  INSERT 0 DEGREE 36 - [id number] Miscellaneous INSERT 0 DEGREE 36  [employer] [id number] Right 1 Implanted  SHELL ACETAB TRIDENT 48 - [id number] Shell SHELL ACETAB TRIDENT 48  [employer] [id number] A Right 1 Implanted  STEM HIP 127 DEG - [id number] Stem STEM HIP 127 DEG  [employer] [id number] A Right 1 Implanted   HEAD CERAMIC FEMORAL 36MM - [id number] Head HEAD CERAMIC FEMORAL 36MM  [employer] [id number] Right 1 Implanted      PROCEDURE IN DETAIL:   The patient was met in the holding area and  identified.  The appropriate hip was identified and marked at the operative site.  The patient was then transported to the OR  and  placed under anesthesia.  At that point, the patient was  placed in the lateral decubitus position with the operative side up and  secured to the operating room table  and all bony prominences padded. A subaxillary role was also placed.    The operative lower extremity was prepped from the iliac crest to the distal leg.  Sterile draping was performed.  Preoperative antibiotics, 2 gm of ancef,1 gm of Tranexamic Acid, and 8 mg of Decadron administered. Time out was performed prior to incision.      A routine posterolateral approach was utilized via sharp dissection  carried down to the subcutaneous tissue.  Gross bleeders were Bovie coagulated.  The iliotibial band was identified and incised along the length of the skin incision through the glute max fascia.  Charnley retractor was placed with care to protect the sciatic nerve posteriorly.  With the hip internally rotated, the piriformis tendon was identified and released from the femoral insertion and tagged with a #5 Ethibond.  A capsulotomy was then performed off the femoral insertion and also tagged with a #2 Ethibond.    The hip was dislocated and the femoral head came out during the dislocation.  The neck was cut to just below the end of the  fracture measuring approximately 10 mm above the lesser trochanter.    I then exposed the deep acetabulum, cleared out any tissue including the ligamentum teres.  After adequate visualization, I excised the labrum.  I then started reaming with a 42 mm reamer, first medializing to the floor of the cotyloid fossa, and then in the position of the cup aiming towards the greater sciatic notch,  matching the version of the transverse acetabular ligament and tucked under the anterior wall. I reamed up to 48 mm reamer with good bony bed preparation and a 48 mm cup was chosen.  The acetabular bone was notably soft and porous.  The real cup was then impacted into place.  Appropriate version and inclination was confirmed clinically matching their bony anatomy, and also with the use of the jig. I placed 2 screws in the posterior superior quadrant to augment fixation.  A neutral liner was placed and impacted. It was confirmed to be appropriately seated and the acetabular retractors were removed.    I then prepared the proximal femur using the box cutter, Charnley awl, and then sequentially broached starting with 0 up to a size 4.  A trial broach, neck, and head was utilized, and I reduced the hip and it was found to have excellent stability.  There was no impingement with full extension and 90 degrees external rotation.  The hip was stable at the position of sleep and with 90 degrees flexion and 80 degrees of internal rotation.  Leg lengths were also clinically assessed in the lateral position and felt to be equal. Intra-Op flatplate was obtained and confirmed appropriate component positions.  Good fill of the femur with the size 4 broach.  And restoration of leg length and offset. No evidence or concern for fracture.  Felt that there would be still potentially some room to go up on the broach.  Gave gentle taps to the size 4 broach and felt that it continued to subside.  Elected to work up to the size 5 broach to the same depth.  A final femoral prosthesis size 5 was selected. I then impacted the real femoral prosthesis into place.I again trialed and selected a 36+ 71m ball. The hip was then reduced and taken through a range of motion. There was no impingement with full extension and 90 degrees external rotation.  The hip was stable at the position of sleep and with 90 degrees flexion and 80 degrees of  internal rotation. Leg lengths were  again assessed and felt to be restored.  We then opened, and I impacted the real head ball into place.  Intra-Op flatplate was obtained and confirmed appropriate component positions.  Good fill of the femur, and restoration of leg length and offset. No evidence or concern for fracture.  The posterior capsule was then closed with #2 Ethibond.  The piriformis was repaired through the base of the abductor tendon using a [place] suture passer.  I then irrigated the hip copiously with dilute Betadine and with normal saline pulse lavage. Periarticular injection was then performed with Exparel.   We repaired the fascia #1 barbed suture, followed by 0 barbed suture for the subcutaneous fat.  Skin was closed with 2-0 Vicryl and 3-0 Monocryl.  Dermabond and Aquacel dressing were applied. The patient was then awakened and returned to PACU in stable and satisfactory condition.  Leg lengths in the supine position were assessed and felt to be clinically equal. There were no complications.  Post op recs: WB: WBAT  RLE, No formal hip precautions Abx: ancef Imaging: PACU pelvis Xray Dressing: Aquacell, keep intact until follow up DVT prophylaxis: Aspirin 81BID starting POD1 Follow up: 2 weeks after surgery for a wound check with Dr. Zachery Dakins at [facility].  Address: [address], [place], [place] [address]  Office Phone: [phone]   Charlies Constable, MD Orthopedic Surgeon

## 2022-08-22 NOTE — H&P (View-Only) (Signed)
ORTHOPAEDIC CONSULTATION  REQUESTING PHYSICIAN: Dimple Nanas, MD  Chief Complaint: R hip fracture  HPI: Madison Malone is a 56 y.o. female who complains of tripped over a dog leash last night landing onto her right hip.  She localizes pain to the right hip and right knee.  She was brought by EMS to [facility] where they found that she has a displaced femoral neck fracture.  She has a complex history of patellar instability and has had multiple knee operations most recently a total knee replacement that was done in [place] and took 3 hours.  She states this was done 6 years ago.  She reports that she never did well after the knee replacement, and has had chronic pain and issues with her knee ever since.  She does report that the fall seems as exacerbated pain in her right knee.  She denies distal numbness and tingling.  She denies pain in other joints or extremities.  Past Medical History:  Diagnosis Date   Arthritis    Chronic low back pain    GERD (gastroesophageal reflux disease)    Osteoporosis    Age 24 apparently per PCP records   Past Surgical History:  Procedure Laterality Date   multiple RIGHT knee surgeries     Social History   Socioeconomic History   Marital status: Single    Spouse name: Not on file   Number of children: Not on file   Years of education: Not on file   Highest education level: Not on file  Occupational History   Not on file  Tobacco Use   Smoking status: Every Day    Packs/day: 1.00    Types: Cigarettes   Smokeless tobacco: Never  Vaping Use   Vaping Use: Never used  Substance and Sexual Activity   Alcohol use: No   Drug use: Not Currently   Sexual activity: Not Currently  Other Topics Concern   Not on file  Social History Narrative   Not on file   Social Determinants of Health   Financial Resource Strain: Not on file  Food Insecurity: Not on file  Transportation Needs: Not on file  Physical Activity: Not on file  Stress: Not  on file  Social Connections: Not on file   Family History  Problem Relation Age of Onset   Hypertension Mother    Osteoarthritis Mother    Anxiety disorder Mother    Kidney failure Father    Alcohol abuse Sister    No Known Allergies   Positive ROS: All other systems have been reviewed and were otherwise negative with the exception of those mentioned in the HPI and as above.  Physical Exam: General: Alert, no acute distress Cardiovascular: No pedal edema Respiratory: No cyanosis, no use of accessory musculature Skin: No lesions in the area of chief complaint Neurologic: Sensation intact distally Psychiatric: Patient is competent for consent with normal mood and affect  MUSCULOSKELETAL:  LLE No traumatic wounds, ecchymosis, or rash  Nontender  No groin pain with log roll  No knee or ankle effusion  Sens DPN, SPN, TN intact  Motor EHL, ext, flex 5/5  DP 2+, PT 2+, No significant edema  RLE No traumatic wounds, ecchymosis, or rash  Tender about knee and hip  Well healed extensive anterior R knee incision  Mild swelling about the ankle likely chronic  No ankle effusion  Sens DPN, SPN, TN intact  Motor EHL, ext, flex 5/5  DP 2+, PT 2+, No significant edema  IMAGING: X-rays right hip demonstrate displaced neck fracture.  Assessment: Principal Problem:   Closed displaced fracture of right femoral neck (HCC) Active Problems:   Osteoporosis   Right displaced femoral neck fracture  Plan: Plan discussed with the patient today that she has a displaced femoral neck fracture would ultimately benefit from arthroplasty.  Discussed merits of Hemi versus total hip arthroplasty.  Given her young age and high activity level feel she would have better function and longevity with a full hip replacement.  Also discussed plan for press-fit versus cemented hip replacement.  Overall her cortices look reasonable on her x-ray today and given her young age again would lean towards a  press-fit stem however given her fracture will have cemented stem available if concern Intra-Op for poor bone quality.  The risks benefits and alternatives were discussed with the patient including but not limited to the risks of nonoperative treatment, versus surgical intervention including infection, bleeding, nerve injury, periprosthetic fracture, the need for revision surgery, dislocation, leg length discrepancy, blood clots, cardiopulmonary complications, morbidity, mortality, among others, and they were willing to proceed.     Patient has not voided yet since the surgery.  Feels she is unable to get on bedpan..  Ordered a Foley catheter to be placed.  Patient is n.p.o., okay for ice chips.  Plan for OR today later this afternoon versus tomorrow morning pending OR availability.  We will update patient and diet order if not able to proceed with surgery today.  X-ray right knee ordered given acute on chronic knee pain after fall and history of complex right knee replacement.    Joen Laura, MD  Contact information:   [id number] 7am-5pm epic message Dr. Blanchie Dessert, or call office for patient follow up: [phone] After hours and holidays please check [email or web address] for group call information for [facility]

## 2022-08-23 DIAGNOSIS — S72001A Fracture of unspecified part of neck of right femur, initial encounter for closed fracture: Secondary | ICD-10-CM | POA: Diagnosis not present

## 2022-08-23 LAB — BASIC METABOLIC PANEL
Anion gap: 9 (ref 5–15)
BUN: 6 mg/dL (ref 6–20)
CO2: 24 mmol/L (ref 22–32)
Calcium: 8.4 mg/dL — ABNORMAL LOW (ref 8.9–10.3)
Chloride: 96 mmol/L — ABNORMAL LOW (ref 98–111)
Creatinine, Ser: 0.6 mg/dL (ref 0.44–1.00)
GFR, Estimated: >60 mL/min (ref >60)
Glucose, Bld: 100 mg/dL — ABNORMAL HIGH (ref 70–99)
Potassium: 3.7 mmol/L (ref 3.5–5.1)
Sodium: 129 mmol/L — ABNORMAL LOW (ref 135–145)

## 2022-08-23 LAB — CBC
HCT: 32.2 % — ABNORMAL LOW (ref 36.0–46.0)
Hemoglobin: 11 g/dL — ABNORMAL LOW (ref 12.0–15.0)
MCH: 34.9 pg — ABNORMAL HIGH (ref 26.0–34.0)
MCHC: 34.2 g/dL (ref 30.0–36.0)
MCV: 102.2 fL — ABNORMAL HIGH (ref 80.0–100.0)
Platelets: 193 10*3/uL (ref 150–400)
RBC: 3.15 MIL/uL — ABNORMAL LOW (ref 3.87–5.11)
RDW: 12.2 % (ref 11.5–15.5)
WBC: 5.5 10*3/uL (ref 4.0–10.5)
nRBC: 0 % (ref 0.0–0.2)

## 2022-08-23 LAB — MAGNESIUM: Magnesium: 1.6 mg/dL — ABNORMAL LOW (ref 1.7–2.4)

## 2022-08-23 LAB — VITAMIN D 25 HYDROXY (VIT D DEFICIENCY, FRACTURES): Vit D, 25-Hydroxy: 10.18 ng/mL — ABNORMAL LOW (ref 30–100)

## 2022-08-23 MED ORDER — SODIUM CHLORIDE 0.9 % IV BOLUS
500.0000 mL | Freq: Once | INTRAVENOUS | Status: AC
  Administered 2022-08-23: 500 mL via INTRAVENOUS

## 2022-08-23 MED ORDER — POTASSIUM CHLORIDE 20 MEQ PO PACK
40.0000 meq | PACK | Freq: Once | ORAL | Status: AC
  Administered 2022-08-23: 40 meq via ORAL
  Filled 2022-08-23: qty 2

## 2022-08-23 MED ORDER — MAGNESIUM SULFATE 4 GM/100ML IV SOLN
4.0000 g | Freq: Once | INTRAVENOUS | Status: AC
  Administered 2022-08-23: 4 g via INTRAVENOUS
  Filled 2022-08-23: qty 100

## 2022-08-23 MED ORDER — ADULT MULTIVITAMIN W/MINERALS CH
1.0000 | ORAL_TABLET | Freq: Every day | ORAL | Status: DC
  Administered 2022-08-23 – 2022-08-25 (×3): 1 via ORAL
  Filled 2022-08-23 (×3): qty 1

## 2022-08-23 MED ORDER — KETOROLAC TROMETHAMINE 15 MG/ML IJ SOLN
7.5000 mg | Freq: Three times a day (TID) | INTRAMUSCULAR | Status: AC
  Administered 2022-08-23 – 2022-08-24 (×6): 7.5 mg via INTRAVENOUS
  Filled 2022-08-23 (×6): qty 1

## 2022-08-23 MED ORDER — OXYCODONE HCL 5 MG PO TABS
5.0000 mg | ORAL_TABLET | ORAL | Status: DC | PRN
  Administered 2022-08-23 – 2022-08-25 (×9): 10 mg via ORAL
  Filled 2022-08-23 (×9): qty 2

## 2022-08-23 MED ORDER — ENSURE ENLIVE PO LIQD: 237.0000 mL | Freq: Three times a day (TID) | ORAL | Status: DC

## 2022-08-23 MED ORDER — VITAMIN D 25 MCG (1000 UNIT) PO TABS
2000.0000 [IU] | ORAL_TABLET | Freq: Two times a day (BID) | ORAL | Status: DC
  Administered 2022-08-23 – 2022-08-25 (×5): 2000 [IU] via ORAL
  Filled 2022-08-23 (×5): qty 2

## 2022-08-23 MED ORDER — VITAMIN D (ERGOCALCIFEROL) 1.25 MG (50000 UNIT) PO CAPS
50000.0000 [IU] | ORAL_CAPSULE | ORAL | Status: DC
  Administered 2022-08-23: 50000 [IU] via ORAL
  Filled 2022-08-23: qty 1

## 2022-08-23 MED ORDER — MAGNESIUM SULFATE 2 GM/50ML IV SOLN: 2.0000 g | Freq: Once | INTRAVENOUS | Status: DC

## 2022-08-23 NOTE — Progress Notes (Signed)
     Subjective:  Patient reports pain as poorly controlled this morning. Denies distal n/t. Eager to work with PT today. Slept poorly overnight. Discussed plan to add toradol and switch hydrocodone to oxy to see if these changes work better. She is in agreement.  Objective:   VITALS:   Vitals:   08/22/22 1700 08/22/22 1715 08/22/22 1927 08/23/22 0332  BP: 127/76 (!) 156/79 128/77 (!) 145/77  Pulse: (!) 104 94 90 (!) 112  Resp: (!) 24 14 16 17   Temp:   99 F (37.2 C) 98.2 F (36.8 C)  TempSrc:      SpO2: 94% 92% 97% 92%  Weight:      Height:        Sensation intact distally Intact pulses distally Dorsiflexion/Plantar flexion intact Incision: dressing C/D/I Compartment soft   Lab Results  Component Value Date   WBC 5.5 08/23/2022   HGB 11.0 (L) 08/23/2022   HCT 32.2 (L) 08/23/2022   MCV 102.2 (H) 08/23/2022   PLT 193 08/23/2022   BMET    Component Value Date/Time   NA 129 (L) 08/23/2022 0051   K 3.7 08/23/2022 0051   CL 96 (L) 08/23/2022 0051   CO2 24 08/23/2022 0051   GLUCOSE 100 (H) 08/23/2022 0051   BUN 6 08/23/2022 0051   CREATININE 0.60 08/23/2022 0051   CALCIUM 8.4 (L) 08/23/2022 0051   GFRNONAA >60 08/23/2022 0051      Xray: post op xrays demonstrate THA components good position no adverse features  Assessment/Plan: 1 Day Post-Op   Principal Problem:   Closed displaced fracture of right femoral neck (HCC) Active Problems:   Osteoporosis  S/p R THA for FN Fx 9/2  Post op recs: WB: WBAT RLE, No formal hip precautions Abx: ancef Imaging: PACU pelvis Xray Dressing: Aquacell, keep intact until follow up DVT prophylaxis: Aspirin 81BID starting POD1 Follow up: 2 weeks after surgery for a wound check with Dr. 11/2 at [facility].  Address: [address], [place], [place]  Office Phone: [phone]    [phone] 08/23/2022, 5:31 AM   10/23/2022, MD  Contact information:   [phone]  7am-5pm epic message Dr. DVVOHYWV, or call office for patient follow up: [phone] After hours and holidays please check [email or web address] for group call information for [facility]

## 2022-08-23 NOTE — Progress Notes (Signed)
PROGRESS NOTE    Madison Malone  ZOX:[id number] DOB: Apr 22, 1966 DOA: 08/22/2022 PCP: Lauro Regulus, MD   Brief Narrative:  56 y.o. female with medical history significant of smoking, chronic low back pain, arthritis.  Apparently osteoporosis per PCP records. Pt presents to ED at [facility] with c/o R hip pain following mechanical fall.  Tripped over her dog's leash, fell on R hip.  Patient transferred here per orthopedic request for further management due to lack of service there.  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   Assessment & Plan:  Principal Problem:   Closed displaced fracture of right femoral neck (HCC) Active Problems:   Osteoporosis     Assessment and Plan: * Closed displaced fracture of right femoral neck (HCC) Transferred from [facility] for surgical management.  Status post right-sided THA on 9/2.  Doing well, no complaints this morning, pain is expected.  Pain management at this time.  Aspirin twice daily for DVT prophylaxis PT/OT  Osteoporosis Supportive care   DVT prophylaxis: Aspirin twice daily Code Status: Full Family Communication:    Pending PT/OT evaluation thereafter safe disposition   Subjective: Right-sided hip pain as expected especially with movement.  Waiting for physical therapy.  Examination:  Constitutional: Not in acute distress Respiratory: Clear to auscultation bilaterally Cardiovascular: Normal sinus rhythm, no rubs Abdomen: Nontender nondistended good bowel sounds Musculoskeletal: No edema noted.  Limited range of motion of the right lower extremity/hip. Skin: No rashes seen Neurologic: CN 2-12 grossly intact.  And nonfocal Psychiatric: Normal judgment and insight. Alert and oriented x 3. Normal mood. Objective: Vitals:   08/22/22 1700 08/22/22 1715 08/22/22 1927 08/23/22 0332  BP: 127/76 (!) 156/79 128/77 (!) 145/77  Pulse: (!) 104 94 90 (!) 112  Resp: (!) 24 14 16 17   Temp:   99 F  (37.2 C) 98.2 F (36.8 C)  TempSrc:      SpO2: 94% 92% 97% 92%  Weight:      Height:        Intake/Output Summary (Last 24 hours) at 08/23/2022 0933 Last data filed at 08/23/2022 0500 Gross per 24 hour  Intake 2338.52 ml  Output 850 ml  Net 1488.52 ml   Filed Weights   08/22/22 0625 08/22/22 1217  Weight: 49 kg 49 kg     Data Reviewed:   CBC: Recent Labs  Lab 08/22/22 0015 08/23/22 0051  WBC 7.7 5.5  NEUTROABS 6.2  --   HGB 12.8 11.0*  HCT 38.0 32.2*  MCV 104.1* 102.2*  PLT 256 193   Basic Metabolic Panel: Recent Labs  Lab 08/22/22 0015 08/23/22 0051  NA 136 129*  K 3.4* 3.7  CL 104 96*  CO2 21* 24  GLUCOSE 95 100*  BUN 16 6  CREATININE 0.60 0.60  CALCIUM 8.8* 8.4*  MG  --  1.6*   GFR: Estimated Creatinine Clearance: 60.7 mL/min (by C-G formula based on SCr of 0.6 mg/dL). Liver Function Tests: Recent Labs  Lab 08/22/22 0015  AST 21  ALT 18  ALKPHOS 58  BILITOT <0.1*  PROT 7.2  ALBUMIN 4.1   No results for input(s): "LIPASE", "AMYLASE" in the last 168 hours. No results for input(s): "AMMONIA" in the last 168 hours. Coagulation Profile: Recent Labs  Lab 08/22/22 0015  INR 1.0   Cardiac Enzymes: No results for input(s): "CKTOTAL", "CKMB", "CKMBINDEX", "TROPONINI" in the last 168 hours. BNP (last 3 results) No results for input(s): "PROBNP" in the last 8760 hours.  HbA1C: No results for input(s): "HGBA1C" in the last 72 hours. CBG: No results for input(s): "GLUCAP" in the last 168 hours. Lipid Profile: No results for input(s): "CHOL", "HDL", "LDLCALC", "TRIG", "CHOLHDL", "LDLDIRECT" in the last 72 hours. Thyroid Function Tests: No results for input(s): "TSH", "T4TOTAL", "FREET4", "T3FREE", "THYROIDAB" in the last 72 hours. Anemia Panel: No results for input(s): "VITAMINB12", "FOLATE", "FERRITIN", "TIBC", "IRON", "RETICCTPCT" in the last 72 hours. Sepsis Labs: No results for input(s): "PROCALCITON", "LATICACIDVEN" in the last 168  hours.  Recent Results (from the past 240 hour(s))  Surgical PCR screen     Status: None   Collection Time: 08/22/22 11:31 AM   Specimen: Nasal Mucosa; Nasal Swab  Result Value Ref Range Status   MRSA, PCR NEGATIVE NEGATIVE Final   Staphylococcus aureus NEGATIVE NEGATIVE Final    Comment: (NOTE) The Xpert SA Assay (FDA approved for NASAL specimens in patients 26 years of age and older), is one component of a comprehensive surveillance program. It is not intended to diagnose infection nor to guide or monitor treatment. Performed at [facility], [address], [place], [place] [address]          Radiology Studies: DG HIP UNILAT W OR W/O PELVIS 2-3 VIEWS RIGHT  Result Date: 08/22/2022 CLINICAL DATA:  Postop total hip arthroplasty. EXAM: DG HIP (WITH OR WITHOUT PELVIS) 2-3V RIGHT COMPARISON:  Preoperative radiograph yesterday. FINDINGS: Right hip arthroplasty in expected alignment. No periprosthetic lucency or fracture. Recent postsurgical change includes air and edema in the soft tissues. IMPRESSION: Right hip arthroplasty without immediate postoperative complication. Electronically Signed   By: Narda Rutherford M.D.   On: 08/22/2022 17:31   DG Pelvis 1-2 Views  Result Date: 08/22/2022 CLINICAL DATA:  THA. EXAM: PELVIS - 1-2 VIEW COMPARISON:  Hip radiograph yesterday. FINDINGS: Two frontal views of the pelvis obtained in the operating room. Image time stamped 1516 hour demonstrates portions of a right hip arthroplasty in place. Image time stamped 1531 hour demonstrates right hip arthroplasty in expected alignment. IMPRESSION: Frontal views of the pelvis obtained during right hip arthroplasty. Electronically Signed   By: Narda Rutherford M.D.   On: 08/22/2022 15:51   DG Knee Right Port  Result Date: 08/22/2022 CLINICAL DATA:  Fall.  History of right knee arthroplasty. EXAM: PORTABLE RIGHT KNEE - 1-2 VIEW COMPARISON:  None Available. FINDINGS: No fracture or bone lesion. The femoral  and tibial prosthetic components appear well seated and aligned. No evidence of loosening. Skeletal structures are demineralized. Soft tissues are unremarkable.  No convincing joint effusion. IMPRESSION: 1. No fracture or acute finding. 2. No evidence of loosening of the arthroplasty components. Electronically Signed   By: Amie Portland M.D.   On: 08/22/2022 09:28   DG Chest 1 View  Result Date: 08/21/2022 CLINICAL DATA:  Post fall with right hip injury. EXAM: CHEST  1 VIEW COMPARISON:  None Available. FINDINGS: The cardiomediastinal contours are normal. The lungs are clear. Pulmonary vasculature is normal. No consolidation, pleural effusion, or pneumothorax. No acute osseous abnormalities are seen. IMPRESSION: No acute chest findings. Electronically Signed   By: Narda Rutherford M.D.   On: 08/21/2022 22:32   DG Hip Unilat  With Pelvis 2-3 Views Right  Result Date: 08/21/2022 CLINICAL DATA:  Post fall with right hip injury. EXAM: DG HIP (WITH OR WITHOUT PELVIS) 2-3V RIGHT COMPARISON:  None Available. FINDINGS: Displaced right femoral neck fracture. There is proximal migration of the femoral shaft. The femoral head remains seated. The pubic  rami are intact. No additional fracture of the pelvis. The pubic symphysis and sacroiliac joints are congruent. IMPRESSION: Displaced right femoral neck fracture. Electronically Signed   By: Narda Rutherford M.D.   On: 08/21/2022 22:31        Scheduled Meds:  aspirin EC  81 mg Oral BID   docusate sodium  100 mg Oral BID   ketorolac  7.5 mg Intravenous Q8H   Vitamin D (Ergocalciferol)  50,000 Units Oral Q7 days   Continuous Infusions:  magnesium sulfate bolus IVPB       LOS: 1 day   Time spent= 35 mins    Doyce Saling Joline Maxcy, MD [facility]  If 7PM-7AM, please contact night-coverage  08/23/2022, 9:33 AM

## 2022-08-23 NOTE — Evaluation (Signed)
Physical Therapy Evaluation Patient Details Name: Madison Malone MRN: [id number] DOB: [birth year] Today's Date: 08/23/2022  History of Present Illness  Madison Malone is a 56 y.o. female with medical history significant of smoking, chronic low back pain, arthritis.  Apparently osteoporosis per PCP records. Pt presents to ED at [facility] with c/o R hip pain following mechanical fall.  Tripped over her dog's leash, fell on R hip. Pt is s/p R THA 2/2 closed displaced fracture of right femoral neck.  Clinical Impression  Pt agreeable to physical therapy evaluation/treatment session. Pt performing functional mobility with minimal assistance at this time. Pt mostly limited by pain and only able to ambulate 4 feet with RW. Pt currently presents with functional limitations secondary to impairments listed in PT problem list. Pt to benefit from skilled, acute care physical therapy interventions to maximize her strength, independence level, and ability to complete ADL's. Anticipate great progress once pain more controlled.      Recommendations for follow up therapy are one component of a multi-disciplinary discharge planning process, led by the attending physician.  Recommendations may be updated based on patient status, additional functional criteria and insurance authorization.  Follow Up Recommendations Outpatient PT      Assistance Recommended at Discharge Intermittent Supervision/Assistance  Patient can return home with the following  A little help with walking and/or transfers;A little help with bathing/dressing/bathroom;Help with stairs or ramp for entrance;Assist for transportation;Assistance with cooking/housework    Equipment Recommendations BSC/3in1  Recommendations for Other Services       Functional Status Assessment Patient has had a recent decline in their functional status and demonstrates the ability to make significant improvements in function in a reasonable and predictable amount  of time.     Precautions / Restrictions Precautions Precautions: Fall Precaution Comments: no hip precautions Restrictions Weight Bearing Restrictions: Yes RLE Weight Bearing: Weight bearing as tolerated      Mobility  Bed Mobility Overal bed mobility: Needs Assistance Bed Mobility: Supine to Sit, Sit to Supine     Supine to sit: Min assist Sit to supine: Min assist   General bed mobility comments: Pt required increased time to complete. Pt requiring min assist for R LE management. Pt requesting to return to bed after session.    Transfers Overall transfer level: Needs assistance Equipment used: Rolling walker (2 wheels) Transfers: Sit to/from Stand Sit to Stand: Min assist           General transfer comment: Pt required cues for technique. Pt performed pre-gait activities (see below) prior to ambulating short distance.    Ambulation/Gait Ambulation/Gait assistance: Min guard Gait Distance (Feet): 4 Feet Assistive device: Rolling walker (2 wheels) Gait Pattern/deviations: Step-to pattern, Decreased stance time - right, Decreased step length - left, Decreased step length - right, Antalgic Gait velocity: decreased Gait velocity interpretation: <1.31 ft/sec, indicative of household ambulator Pre-gait activities: Pt performed side steps and forwards/backwards steps in place prior to gait away from bed. General Gait Details: Pt performed modified three point pattern as instructed. Pt with no LOB. Fatigue, weakness, and pain limiting.  Stairs            Wheelchair Mobility    Modified Rankin (Stroke Patients Only)       Balance Overall balance assessment: Needs assistance   Sitting balance-Leahy Scale: Fair (at least)     Standing balance support: Bilateral upper extremity supported, Reliant on assistive device for balance Standing balance-Leahy Scale: Poor  Pertinent Vitals/Pain Pain Assessment Pain  Assessment: 0-10 Pain Score: 10-Worst pain ever Pain Location: R hip Pain Descriptors / Indicators: Sharp, Burning Pain Intervention(s): Premedicated before session, Monitored during session, Limited activity within patient's tolerance    Home Living Family/patient expects to be discharged to:: Private residence Living Arrangements: Alone Available Help at Discharge: Friend(s);Available PRN/intermittently Type of Home: House Home Access: Stairs to enter Entrance Stairs-Rails: Right (going up R in front and L in back) Entrance Stairs-Number of Steps: 4 (6 in back)   Home Layout: One level Home Equipment: Cane - single point;Rolling Walker (2 wheels)      Prior Function Prior Level of Function : Independent/Modified Independent;Driving;Working/employed (medical billing for work; 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       Hand Dominance        Extremity/Trunk Assessment   Upper Extremity Assessment Upper Extremity Assessment: Overall WFL for tasks assessed    Lower Extremity Assessment Lower Extremity Assessment:  (Grossly WFL L LE and R LE 2-/5 to 3-/5)       Communication   Communication: No difficulties  Cognition Arousal/Alertness: Awake/alert Behavior During Therapy: WFL for tasks assessed/performed Overall Cognitive Status: Within Functional Limits for tasks assessed                                          General Comments      Exercises     Assessment/Plan    PT Assessment Patient needs continued PT services  PT Problem List Decreased range of motion;Decreased strength;Decreased activity tolerance;Decreased balance;Decreased mobility;Pain       PT Treatment Interventions DME instruction;Gait training;Stair training;Functional mobility training;Therapeutic activities;Therapeutic exercise;Balance training;Neuromuscular re-education;Patient/family education;Modalities    PT Goals  (Current goals can be found in the Care Plan section)  Acute Rehab PT Goals Patient Stated Goal: none stated PT Goal Formulation: With patient Time For Goal Achievement: 09/04/22 Potential to Achieve Goals: Good    Frequency Min 5X/week     Co-evaluation               AM-PAC PT "6 Clicks" Mobility  Outcome Measure Help needed turning from your back to your side while in a flat bed without using bedrails?: A Little Help needed moving from lying on your back to sitting on the side of a flat bed without using bedrails?: A Little Help needed moving to and from a bed to a chair (including a wheelchair)?: A Little Help needed standing up from a chair using your arms (e.g., wheelchair or bedside chair)?: A Little Help needed to walk in hospital room?: A Lot Help needed climbing 3-5 steps with a railing? : A Lot 6 Click Score: 16    End of Session Equipment Utilized During Treatment: Gait belt Activity Tolerance: Patient limited by pain Patient left: in bed;with call bell/phone within reach Nurse Communication: Mobility status;Patient requests pain meds PT Visit Diagnosis: Other abnormalities of gait and mobility (R26.89);Muscle weakness (generalized) (M62.81);Pain Pain - Right/Left: Right Pain - part of body: Hip    Time: 1316-1350 PT Time Calculation (min) (ACUTE ONLY): 34 min   Charges:   PT Evaluation $PT Eval Low Complexity: 1 Low PT Treatments $Therapeutic Activity: 8-22 mins        Tana Coast, PT   [employer]  08/23/2022, 3:25 PM

## 2022-08-23 NOTE — Progress Notes (Signed)
Initial Nutrition Assessment  DOCUMENTATION CODES:   Underweight  INTERVENTION:   -Ensure Enlive po TID, each supplement provides 350 kcal and 20 grams of protein -MVI with minerals daily  NUTRITION DIAGNOSIS:   Increased nutrient needs related to post-op healing as evidenced by estimated needs.  GOAL:   Patient will meet greater than or equal to 90% of their needs  MONITOR:   PO intake, Supplement acceptance  REASON FOR ASSESSMENT:   Consult Assessment of nutrition requirement/status  ASSESSMENT:      Pt admitted with rt hip fracture.   9/2- s/p Right total hip arthroplasty  Reviewed I/O's: +1.5 L x 24 hours  UOP: 700 ml x 24 hours  Pt unavailable at time of visit. Attempted to speak with pt via call to hospital room phone, however, unable to reach. RD unable to obtain further nutrition-related history or complete nutrition-focused physical exam at this time.    Pt currently on a regular diet. No meal completion data available to assess at this time.    Reviewed wt hx; no wt loss noted over the past 5 years.   Given pt's underweight status, highly suspect malnutrition, however, unable to identify at this time. Pt would greatly benefit from addition of oral nutrition supplements.   Medications reviewed and include colace, vitamin D3, vitamin D, and IV magnesium.   Labs reviewed: Na: 129. Mg: 1.6 (on IV supplementation). K WDL.  Diet Order:   Diet Order             Diet regular Room service appropriate? Yes; Fluid consistency: Thin  Diet effective now                   EDUCATION NEEDS:   No education needs have been identified at this time  Skin:  Skin Assessment: Skin Integrity Issues: Skin Integrity Issues:: Incisions Incisions: closed rt leg  Last BM:  08/21/22  Height:   Ht Readings from Last 1 Encounters:  08/22/22 5\' 6"  (1.676 m)    Weight:   Wt Readings from Last 1 Encounters:  08/22/22 49 kg    Ideal Body Weight:  59.1  kg  BMI:  Body mass index is 17.43 kg/m.  Estimated Nutritional Needs:   Kcal:  1800-2000  Protein:  100-115 grams  Fluid:  > 1.8 L    10/22/22, RD, LDN, CDCES Registered Dietitian II Certified Diabetes Care and Education Specialist Please refer to [facility] for RD and/or RD on-call/weekend/after hours pager

## 2022-08-23 NOTE — Progress Notes (Signed)
OT Cancellation Note  Patient Details Name: Madison Malone MRN: [id number] DOB: 01/22/1966   Cancelled Treatment:    Reason Eval/Treat Not Completed: Patient declined, no reason specified Pt reporting planning to eat soon (just ordered lunch) and did not want to participate in OT eval at this time. Will follow up tomorrow for OT eval.   Lorre Munroe 08/23/2022, 2:31 PM

## 2022-08-24 DIAGNOSIS — E559 Vitamin D deficiency, unspecified: Secondary | ICD-10-CM | POA: Diagnosis present

## 2022-08-24 DIAGNOSIS — S72001A Fracture of unspecified part of neck of right femur, initial encounter for closed fracture: Secondary | ICD-10-CM | POA: Diagnosis not present

## 2022-08-24 LAB — BASIC METABOLIC PANEL
Anion gap: 9 (ref 5–15)
BUN: 5 mg/dL — ABNORMAL LOW (ref 6–20)
CO2: 25 mmol/L (ref 22–32)
Calcium: 8.4 mg/dL — ABNORMAL LOW (ref 8.9–10.3)
Chloride: 90 mmol/L — ABNORMAL LOW (ref 98–111)
Creatinine, Ser: 0.61 mg/dL (ref 0.44–1.00)
GFR, Estimated: >60 mL/min (ref >60)
Glucose, Bld: 95 mg/dL (ref 70–99)
Potassium: 4.3 mmol/L (ref 3.5–5.1)
Sodium: 124 mmol/L — ABNORMAL LOW (ref 135–145)

## 2022-08-24 LAB — CBC
HCT: 30.2 % — ABNORMAL LOW (ref 36.0–46.0)
Hemoglobin: 10.5 g/dL — ABNORMAL LOW (ref 12.0–15.0)
MCH: 35.4 pg — ABNORMAL HIGH (ref 26.0–34.0)
MCHC: 34.8 g/dL (ref 30.0–36.0)
MCV: 101.7 fL — ABNORMAL HIGH (ref 80.0–100.0)
Platelets: 183 10*3/uL (ref 150–400)
RBC: 2.97 MIL/uL — ABNORMAL LOW (ref 3.87–5.11)
RDW: 12 % (ref 11.5–15.5)
WBC: 7.6 10*3/uL (ref 4.0–10.5)
nRBC: 0 % (ref 0.0–0.2)

## 2022-08-24 LAB — SODIUM
Sodium: 127 mmol/L — ABNORMAL LOW (ref 135–145)
Sodium: 128 mmol/L — ABNORMAL LOW (ref 135–145)

## 2022-08-24 LAB — OSMOLALITY, URINE: Osmolality, Ur: 224 mOsm/kg — ABNORMAL LOW (ref 300–900)

## 2022-08-24 LAB — OSMOLALITY: Osmolality: 256 mOsm/kg — ABNORMAL LOW (ref 275–295)

## 2022-08-24 LAB — SODIUM, URINE, RANDOM: Sodium, Ur: <10 mmol/L

## 2022-08-24 LAB — MAGNESIUM: Magnesium: 2 mg/dL (ref 1.7–2.4)

## 2022-08-24 MED ORDER — METHOCARBAMOL 500 MG PO TABS
500.0000 mg | ORAL_TABLET | Freq: Three times a day (TID) | ORAL | 0 refills | Status: DC | PRN
Start: 2022-08-24 — End: 2022-08-25

## 2022-08-24 MED ORDER — SODIUM CHLORIDE 0.9 % IV SOLN: INTRAVENOUS | Status: AC

## 2022-08-24 MED ORDER — DOCUSATE SODIUM 100 MG PO CAPS: 100.0000 mg | ORAL_CAPSULE | Freq: Two times a day (BID) | ORAL | 0 refills | Status: AC

## 2022-08-24 MED ORDER — VITAMIN D3 25 MCG PO TABS: 2000.0000 [IU] | ORAL_TABLET | Freq: Two times a day (BID) | ORAL | 0 refills | Status: AC

## 2022-08-24 MED ORDER — ASPIRIN 81 MG PO TBEC: 81.0000 mg | DELAYED_RELEASE_TABLET | Freq: Two times a day (BID) | ORAL | 12 refills | Status: DC

## 2022-08-24 MED ORDER — OXYCODONE-ACETAMINOPHEN 5-325 MG PO TABS: 1.0000 | ORAL_TABLET | Freq: Four times a day (QID) | ORAL | 0 refills | Status: DC | PRN

## 2022-08-24 MED ORDER — ACETAMINOPHEN 325 MG PO TABS: 325.0000 mg | ORAL_TABLET | Freq: Three times a day (TID) | ORAL | 0 refills | Status: DC | PRN

## 2022-08-24 MED ORDER — VITAMIN D (ERGOCALCIFEROL) 1.25 MG (50000 UNIT) PO CAPS
50000.0000 [IU] | ORAL_CAPSULE | ORAL | 0 refills | Status: DC
Start: 2022-08-30 — End: 2024-06-10

## 2022-08-24 NOTE — Plan of Care (Signed)

## 2022-08-24 NOTE — Evaluation (Signed)
Occupational Therapy Evaluation Patient Details Name: Madison Malone MRN: [id number] DOB: August 05, 1966 Today's Date: 08/24/2022   History of Present Illness Madison Malone is a 56 y.o. female with medical history significant of smoking, chronic low back pain, arthritis.  Apparently osteoporosis per PCP records. Pt presents to ED at [facility] with c/o R hip pain following mechanical fall.  Tripped over her dog's leash, fell on R hip. Pt is s/p R THA 2/2 closed displaced fracture of right femoral neck.   Clinical Impression   Pt independent at baseline with ADLs and functional mobility, lives alone and has neighbors/friends that can assist PRN. Pt currently supervision-min A for ADLs, supervision for bed mobility, and supervision for transfers with RW. Pt slightly impulsive with mobility, standing before cued. Pt presenting with impairments listed below, will follow acutely. Recommend no OT follow up at d/c pending progression.      Recommendations for follow up therapy are one component of a multi-disciplinary discharge planning process, led by the attending physician.  Recommendations may be updated based on patient status, additional functional criteria and insurance authorization.   Follow Up Recommendations  No OT follow up    Assistance Recommended at Discharge Set up Supervision/Assistance  Patient can return home with the following A little help with walking and/or transfers;A little help with bathing/dressing/bathroom;Assist for transportation;Assistance with cooking/housework;Help with stairs or ramp for entrance    Functional Status Assessment  Patient has had a recent decline in their functional status and demonstrates the ability to make significant improvements in function in a reasonable and predictable amount of time.  Equipment Recommendations  BSC/3in1 (as shower seat, pt may decline)    Recommendations for Other Services PT consult     Precautions / Restrictions  Precautions Precautions: Fall Precaution Comments: no hip precautions Restrictions Weight Bearing Restrictions: Yes RLE Weight Bearing: Weight bearing as tolerated      Mobility Bed Mobility Overal bed mobility: Needs Assistance Bed Mobility: Supine to Sit, Sit to Supine     Supine to sit: Supervision Sit to supine: Supervision        Transfers Overall transfer level: Needs assistance Equipment used: Rolling walker (2 wheels) Transfers: Sit to/from Stand Sit to Stand: Supervision           General transfer comment: standing before cued      Balance Overall balance assessment: Needs assistance   Sitting balance-Leahy Scale: Good     Standing balance support: Bilateral upper extremity supported, No upper extremity supported Standing balance-Leahy Scale: Fair                             ADL either performed or assessed with clinical judgement   ADL Overall ADL's : Needs assistance/impaired Eating/Feeding: Supervision/ safety   Grooming: Supervision/safety   Upper Body Bathing: Supervision/ safety   Lower Body Bathing: Minimal assistance   Upper Body Dressing : Supervision/safety   Lower Body Dressing: Minimal assistance   Toilet Transfer: Min guard;Ambulation;Regular Social worker and Hygiene: Supervision/safety       Functional mobility during ADLs: Min guard;Rolling walker (2 wheels)       Vision   Vision Assessment?: No apparent visual deficits     Perception     Praxis      Pertinent Vitals/Pain Pain Assessment Pain Assessment: Faces Pain Score: 3  Faces Pain Scale: Hurts little more Pain Location: R hip Pain Descriptors / Indicators: Discomfort Pain Intervention(s): Limited activity  within patient's tolerance, Monitored during session, Repositioned     Hand Dominance     Extremity/Trunk Assessment Upper Extremity Assessment Upper Extremity Assessment: Overall WFL for tasks assessed    Lower Extremity Assessment Lower Extremity Assessment: Defer to PT evaluation   Cervical / Trunk Assessment Cervical / Trunk Assessment: Normal   Communication Communication Communication: No difficulties   Cognition Arousal/Alertness: Awake/alert Behavior During Therapy: Impulsive Overall Cognitive Status: Within Functional Limits for tasks assessed                                 General Comments: slightly agitated with therapy coming for evaluation, reiterates that she is going to manage "fine" at home     General Comments       Exercises     Shoulder Instructions      Home Living Family/patient expects to be discharged to:: Private residence Living Arrangements: Alone Available Help at Discharge: Friend(s);Available PRN/intermittently Type of Home: House Home Access: Stairs to enter [employer] of Steps: 4 Entrance Stairs-Rails: Right Home Layout: One level     Bathroom Shower/Tub: Producer, television/film/video: Standard Bathroom Accessibility: Yes   Home Equipment: Cane - single point;Rolling Walker (2 wheels)          Prior Functioning/Environment Prior Level of Function : Independent/Modified Independent;Driving;Working/employed             Mobility Comments: Does not use AD. 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, works in medical billing        OT Problem List: Decreased strength;Decreased range of motion;Decreased activity tolerance;Impaired balance (sitting and/or standing);Decreased safety awareness      OT Treatment/Interventions: Self-care/ADL training;Therapeutic exercise;Energy conservation;DME and/or AE instruction;Therapeutic activities;Balance training;Patient/family education    OT Goals(Current goals can be found in the care plan section) Acute Rehab OT Goals Patient Stated Goal: noen stated OT Goal Formulation: With patient Time For Goal Achievement: 09/07/22 Potential to Achieve Goals: Good ADL  Goals Pt Will Perform Lower Body Dressing: Independently;sitting/lateral leans;sit to/from stand;with adaptive equipment Pt Will Transfer to Toilet: Independently;ambulating;regular height toilet Pt Will Perform Tub/Shower Transfer: Shower transfer;ambulating;rolling walker  OT Frequency: Min 2X/week    Co-evaluation              AM-PAC OT "6 Clicks" Daily Activity     Outcome Measure Help from another person eating meals?: None Help from another person taking care of personal grooming?: None Help from another person toileting, which includes using toliet, bedpan, or urinal?: A Little Help from another person bathing (including washing, rinsing, drying)?: A Little Help from another person to put on and taking off regular upper body clothing?: A Little Help from another person to put on and taking off regular lower body clothing?: A Little 6 Click Score: 20   End of Session Equipment Utilized During Treatment: Gait belt;Rolling walker (2 wheels) Nurse Communication: Patient requests pain meds;Mobility status  Activity Tolerance: Patient tolerated treatment well Patient left: in bed;with call bell/phone within reach;with bed alarm set  OT Visit Diagnosis: Unsteadiness on feet (R26.81);Other abnormalities of gait and mobility (R26.89);Muscle weakness (generalized) (M62.81)                Time: 1610-9604 OT Time Calculation (min): 12 min Charges:  OT General Charges $OT Visit: 1 Visit OT Evaluation $OT Eval Low Complexity: 1 Low  Karem Farha, OTD, OTR/L Acute Rehab [phone]   Mayer Masker  08/24/2022, 1:16 PM

## 2022-08-24 NOTE — Progress Notes (Signed)
Physical Therapy Treatment Patient Details Name: Madison Malone MRN: [id number] DOB: 1966/12/12 Today's Date: 08/24/2022   History of Present Illness Madison Malone is a 56 y.o. female with medical history significant of smoking, chronic low back pain, arthritis.  Apparently osteoporosis per PCP records. Pt presents to ED at [facility] with c/o R hip pain following mechanical fall.  Tripped over her dog's leash, fell on R hip. Pt is s/p R THA 2/2 closed displaced fracture of right femoral neck.    PT Comments    Pt with excellent progress from initial eval. Pt instructed in and performed therapeutic exercises and gait training respectively. Pt ambulated 180 feet with mostly supervision. Pt will continue to benefit from skilled, acute care physical therapy interventions to maximize her current level of function and progress towards established goals including navigating stairs specific to her home environment.   Recommendations for follow up therapy are one component of a multi-disciplinary discharge planning process, led by the attending physician.  Recommendations may be updated based on patient status, additional functional criteria and insurance authorization.  Follow Up Recommendations  Outpatient PT     Assistance Recommended at Discharge Intermittent Supervision/Assistance  Patient can return home with the following A little help with walking and/or transfers;A little help with bathing/dressing/bathroom;Help with stairs or ramp for entrance;Assist for transportation;Assistance with cooking/housework   Equipment Recommendations  BSC/3in1    Recommendations for Other Services       Precautions / Restrictions Precautions Precautions: Fall Precaution Comments: no hip precautions Restrictions Weight Bearing Restrictions: Yes RLE Weight Bearing: Weight bearing as tolerated     Mobility  Bed Mobility Overal bed mobility: Needs Assistance Bed Mobility: Supine to Sit     Supine  to sit: Supervision     General bed mobility comments: Pt with improvements scooting EOB and did not need physical assistance for R LE management.    Transfers Overall transfer level: Needs assistance Equipment used: None Transfers: Sit to/from Stand Sit to Stand: Supervision           General transfer comment: Pt slightly impulsive and standing prior to therapist bringing RW to her.    Ambulation/Gait Ambulation/Gait assistance: Min guard, Supervision Gait Distance (Feet): 180 Feet Assistive device: Rolling walker (2 wheels) Gait Pattern/deviations: Step-to pattern, Decreased stance time - right, Decreased step length - left, Decreased step length - right, Antalgic, Trunk flexed Gait velocity: decreased Gait velocity interpretation: <1.31 ft/sec, indicative of household ambulator   General Gait Details: Pt performed modified three point pattern as instructed. Pt required cues for R heel-toe sequencing, upright posture, and pacing.   Stairs             Wheelchair Mobility    Modified Rankin (Stroke Patients Only)       Balance Overall balance assessment: Needs assistance   Sitting balance-Leahy Scale: Good     Standing balance support: Bilateral upper extremity supported, No upper extremity supported Standing balance-Leahy Scale: Fair                              Cognition Arousal/Alertness: Awake/alert Behavior During Therapy: Impulsive Overall Cognitive Status: Within Functional Limits for tasks assessed                                          Exercises General Exercises - Lower Extremity Ankle Circles/Pumps:  Both, 20 reps, Supine Quad Sets: Right, 10 reps, Supine Gluteal Sets: Right, 10 reps, Supine (3 sec holds) Heel Slides: Right, 10 reps, Supine Hip ABduction/ADduction: Right, Other (comment) (unable when attempted AROM; single rep performed slowly via AAROM)    General Comments        Pertinent Vitals/Pain  Pain Assessment Pain Assessment: 0-10 Pain Score: 8  Pain Location: R hip Pain Descriptors / Indicators: Sharp, Burning Pain Intervention(s): Limited activity within patient's tolerance, Monitored during session, Premedicated before session, Ice applied    Home Living                          Prior Function            PT Goals (current goals can now be found in the care plan section) Acute Rehab PT Goals Patient Stated Goal: none stated PT Goal Formulation: With patient Time For Goal Achievement: 09/04/22 Potential to Achieve Goals: Good Progress towards PT goals: Progressing toward goals    Frequency    Min 5X/week      PT Plan Current plan remains appropriate    Co-evaluation              AM-PAC PT "6 Clicks" Mobility   Outcome Measure  Help needed turning from your back to your side while in a flat bed without using bedrails?: A Little Help needed moving from lying on your back to sitting on the side of a flat bed without using bedrails?: A Little Help needed moving to and from a bed to a chair (including a wheelchair)?: A Little Help needed standing up from a chair using your arms (e.g., wheelchair or bedside chair)?: A Little Help needed to walk in hospital room?: A Little Help needed climbing 3-5 steps with a railing? : A Little 6 Click Score: 18    End of Session Equipment Utilized During Treatment: Gait belt Activity Tolerance: Patient limited by pain Patient left: in chair;with call bell/phone within reach Nurse Communication: Mobility status PT Visit Diagnosis: Other abnormalities of gait and mobility (R26.89);Muscle weakness (generalized) (M62.81);Pain Pain - Right/Left: Right Pain - part of body: Hip     Time: 1478-2956 PT Time Calculation (min) (ACUTE ONLY): 23 min  Charges:  $Gait Training: 8-22 mins $Therapeutic Exercise: 8-22 mins                     Tana Coast, PT    [employer] 08/24/2022, 10:17 AM

## 2022-08-24 NOTE — Progress Notes (Signed)
Orthopaedic Trauma Service Progress Note  Patient ID: Madison Malone MRN: [id number] DOB/AGE: [birth year] 56 y.o.  Subjective:  Doing better this am  Ambulated 180 ft with therapy today, much improved from yesterday   Still dealing with some hyponatremia   Plans to dc home  3-5 stairs to enter house depending on front or back    ROS As above  Objective:   VITALS:   Vitals:   08/23/22 0332 08/23/22 1000 08/23/22 2100 08/24/22 0746  BP: (!) 145/77 129/69 134/89 112/64  Pulse: (!) 112 (!) 109 (!) 105 (!) 110  Resp: 17 18 17    Temp: 98.2 F (36.8 C) 100 F (37.8 C) 99.5 F (37.5 C) 99.9 F (37.7 C)  TempSrc:  Oral Oral Oral  SpO2: 92% 96% 98% 99%  Weight:      Height:        Estimated body mass index is 17.43 kg/m as calculated from the following:   Height as of this encounter: 5\' 6"  (1.676 m).   Weight as of this encounter: 49 kg.   Intake/Output      09/03 0701 09/04 0700 09/04 0701 09/05 0700   I.V. (mL/kg)     IV Piggyback 100    Total Intake(mL/kg) 100 (2)    Urine (mL/kg/hr) 1250 (1.1)    Blood     Total Output 1250    Net -1150         Urine Occurrence 1 x      LABS  Results for orders placed or performed during the hospital encounter of 08/22/22 (from the past 24 hour(s))  Basic metabolic panel     Status: Abnormal   Collection Time: 08/24/22 12:54 AM  Result Value Ref Range   Sodium 124 (L) 135 - 145 mmol/L   Potassium 4.3 3.5 - 5.1 mmol/L   Chloride 90 (L) 98 - 111 mmol/L   CO2 25 22 - 32 mmol/L   Glucose, Bld 95 70 - 99 mg/dL   BUN 5 (L) 6 - 20 mg/dL   Creatinine, Ser 10/22/22 0.44 - 1.00 mg/dL   Calcium 8.4 (L) 8.9 - 10.3 mg/dL   GFR, Estimated 10/24/22 0.73 mL/min   Anion gap 9 5 - 15  CBC     Status: Abnormal   Collection Time: 08/24/22 12:54 AM  Result Value Ref Range   WBC 7.6 4.0 - 10.5 K/uL   RBC 2.97 (L) 3.87 - 5.11 MIL/uL   Hemoglobin 10.5 (L) 12.0 -  15.0 g/dL   HCT >06 (L) 10/24/22 - 26.9 %   MCV 101.7 (H) 80.0 - 100.0 fL   MCH 35.4 (H) 26.0 - 34.0 pg   MCHC 34.8 30.0 - 36.0 g/dL   RDW 48.5 46.2 - 70.3 %   Platelets 183 150 - 400 K/uL   nRBC 0.0 0.0 - 0.2 %  Magnesium     Status: None   Collection Time: 08/24/22 12:54 AM  Result Value Ref Range   Magnesium 2.0 1.7 - 2.4 mg/dL  Sodium, urine, random     Status: None   Collection Time: 08/24/22  7:51 AM  Result Value Ref Range   Sodium, Ur <10 mmol/L  Osmolality, urine     Status: Abnormal   Collection Time: 08/24/22  9:41 AM  Result Value Ref Range  Osmolality, Ur 224 (L) 300 - 900 mOsm/kg     PHYSICAL EXAM:   Gen: resting comfortably in bed, NAD, very pleasant, friends at bedside. Sitting up eating chipotle  Lungs: unlabored Cardiac: reg Ext:       Right Lower Extremity   Dressing R hip clean, dry, intact (aquacel pristine)  Ext warm   + DP pulse  No DCT   Compartments soft   No pain with passive stretch   EHL, FHL, lesser toe motor intact  Ankle flexion, extension, inversion and eversion intact    Assessment/Plan: 2 Days Post-Op   Principal Problem:   Closed displaced fracture of right femoral neck (HCC) Active Problems:   Osteoporosis   Vitamin D deficiency   Anti-infectives (From admission, onward)    Start     Dose/Rate Route Frequency Ordered Stop   08/23/22 0600  ceFAZolin (ANCEF) IVPB 2g/100 mL premix        2 g 200 mL/hr over 30 Minutes Intravenous On call to O.R. 08/22/22 1217 08/22/22 1414   08/22/22 2200  ceFAZolin (ANCEF) IVPB 2g/100 mL premix        2 g 200 mL/hr over 30 Minutes Intravenous Every 8 hours 08/22/22 1736 08/23/22 0758   08/22/22 1231  ceFAZolin (ANCEF) 2-4 GM/100ML-% IVPB       Note to Pharmacy: Samuella Cota O: cabinet override      08/22/22 1231 08/23/22 0044     .  POD/HD#: 2  56 y/o female s/p R THA for R femoral neck fracture   -R femoral neck fracture s/p R THA  WBAT   No formal ROM restrictions   Therapies    Dressing to remain in place until follow up (2 weeks post op)   Rxs sent to listed pharmacy for percocet, vitamin d, robaxin, asa   - Pain management:  Multimodal   - ABL anemia/Hemodynamics  H/h looks good  Monitor  - Medical issues   Hyponatremia---> per medicine  - DVT/PE prophylaxis:  Asa 81 mg BID   - ID:   Periop abx completed   - Metabolic Bone Disease:  Vitamin d deficiency    Supplement  Fracture indicative of osteoporosis   Will need follow with osteoporosis specialist  - Activity:  As above   - Dispo:  Ortho issues stable  Follow up with Dr. Blanchie Dessert in 2 weeks    Mearl Latin, PA-C [phone] (C) 08/24/2022, 12:30 PM  [facility] [address] [place] [address] [phone] [phone] (F)    After 5pm and on the weekends please log on to Amion, go to orthopaedics and the look under the [facility] Call for the provider(s) on call. You can also call our office at [phone] and then follow the prompts to be connected to the call team.   Patient ID: Madison Malone, female   DOB: 10-21-1966, 56 y.o.   MRN: [id number]

## 2022-08-24 NOTE — Progress Notes (Signed)
PROGRESS NOTE    Madison Malone  GYB:[id number] DOB: [birth year] DOA: 08/22/2022 PCP: Lauro Regulus, MD   Brief Narrative:  56 y.o. female with medical history significant of smoking, chronic low back pain, arthritis.  Apparently osteoporosis per PCP records. Pt presents to ED at [facility] with c/o R hip pain following mechanical fall.  Tripped over her dog's leash, fell on R hip.  Patient transferred here per orthopedic request for further management due to lack of service there.  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  Postop course complicated by hyponatremia.  Ongoing management.   Assessment & Plan:  Principal Problem:   Closed displaced fracture of right femoral neck (HCC) Active Problems:   Osteoporosis     Assessment and Plan: * Closed displaced fracture of right femoral neck (HCC) Transferred from [facility] for surgical management.  Status post right-sided THA on 9/2.  Doing well, no complaints this morning, pain is expected.  Pain management at this time.  Aspirin twice daily for DVT prophylaxis PT/OT-home health.  TOC consulted.   Hyponatremia - I suspect from hypovolemia but will check urine sodium, awesome, serum awesome.  IV fluids.  Sodium levels every 6 hours  Osteoporosis Supportive care   DVT prophylaxis: Aspirin twice daily Code Status: Full Family Communication:    Maintain hospital stay due to worsening hyponatremia and ongoing management for it.  Subjective: Seen and examined at bedside.  Tells me yesterday due to her pain she has had to use IV and p.o. pain medications.  Her mouth feels dry this morning.  Overall she feels little better over last 24 hours.  Examination: Constitutional: Not in acute distress Respiratory: Clear to auscultation bilaterally Cardiovascular: Normal sinus rhythm, no rubs Abdomen: Nontender nondistended good bowel sounds Musculoskeletal: Limb with a range of motion of her right lower  extremity due to discomfort but improving postoperatively Skin: No rashes seen Neurologic: CN 2-12 grossly intact.  And nonfocal Psychiatric: Normal judgment and insight. Alert and oriented x 3. Normal mood.   Objective: Vitals:   08/23/22 0332 08/23/22 1000 08/23/22 2100 08/24/22 0746  BP: (!) 145/77 129/69 134/89 112/64  Pulse: (!) 112 (!) 109 (!) 105 (!) 110  Resp: 17 18 17    Temp: 98.2 F (36.8 C) 100 F (37.8 C) 99.5 F (37.5 C) 99.9 F (37.7 C)  TempSrc:  Oral Oral Oral  SpO2: 92% 96% 98% 99%  Weight:      Height:        Intake/Output Summary (Last 24 hours) at 08/24/2022 0750 Last data filed at 08/23/2022 1730 Gross per 24 hour  Intake 100 ml  Output 1250 ml  Net -1150 ml   Filed Weights   08/22/22 0625 08/22/22 1217  Weight: 49 kg 49 kg     Data Reviewed:   CBC: Recent Labs  Lab 08/22/22 0015 08/23/22 0051 08/24/22 0054  WBC 7.7 5.5 7.6  NEUTROABS 6.2  --   --   HGB 12.8 11.0* 10.5*  HCT 38.0 32.2* 30.2*  MCV 104.1* 102.2* 101.7*  PLT 256 193 183   Basic Metabolic Panel: Recent Labs  Lab 08/22/22 0015 08/23/22 0051 08/24/22 0054  NA 136 129* 124*  K 3.4* 3.7 4.3  CL 104 96* 90*  CO2 21* 24 25  GLUCOSE 95 100* 95  BUN 16 6 5*  CREATININE 0.60 0.60 0.61  CALCIUM 8.8* 8.4* 8.4*  MG  --  1.6* 2.0   GFR: Estimated Creatinine Clearance: 60.7  mL/min (by C-G formula based on SCr of 0.61 mg/dL). Liver Function Tests: Recent Labs  Lab 08/22/22 0015  AST 21  ALT 18  ALKPHOS 58  BILITOT <0.1*  PROT 7.2  ALBUMIN 4.1   No results for input(s): "LIPASE", "AMYLASE" in the last 168 hours. No results for input(s): "AMMONIA" in the last 168 hours. Coagulation Profile: Recent Labs  Lab 08/22/22 0015  INR 1.0   Cardiac Enzymes: No results for input(s): "CKTOTAL", "CKMB", "CKMBINDEX", "TROPONINI" in the last 168 hours. BNP (last 3 results) No results for input(s): "PROBNP" in the last 8760 hours. HbA1C: No results for input(s): "HGBA1C" in  the last 72 hours. CBG: No results for input(s): "GLUCAP" in the last 168 hours. Lipid Profile: No results for input(s): "CHOL", "HDL", "LDLCALC", "TRIG", "CHOLHDL", "LDLDIRECT" in the last 72 hours. Thyroid Function Tests: No results for input(s): "TSH", "T4TOTAL", "FREET4", "T3FREE", "THYROIDAB" in the last 72 hours. Anemia Panel: No results for input(s): "VITAMINB12", "FOLATE", "FERRITIN", "TIBC", "IRON", "RETICCTPCT" in the last 72 hours. Sepsis Labs: No results for input(s): "PROCALCITON", "LATICACIDVEN" in the last 168 hours.  Recent Results (from the past 240 hour(s))  Surgical PCR screen     Status: None   Collection Time: 08/22/22 11:31 AM   Specimen: Nasal Mucosa; Nasal Swab  Result Value Ref Range Status   MRSA, PCR NEGATIVE NEGATIVE Final   Staphylococcus aureus NEGATIVE NEGATIVE Final    Comment: (NOTE) The Xpert SA Assay (FDA approved for NASAL specimens in patients 38 years of age and older), is one component of a comprehensive surveillance program. It is not intended to diagnose infection nor to guide or monitor treatment. Performed at [facility], [address], [place], [place] [address]          Radiology Studies: DG HIP UNILAT W OR W/O PELVIS 2-3 VIEWS RIGHT  Result Date: 08/22/2022 CLINICAL DATA:  Postop total hip arthroplasty. EXAM: DG HIP (WITH OR WITHOUT PELVIS) 2-3V RIGHT COMPARISON:  Preoperative radiograph yesterday. FINDINGS: Right hip arthroplasty in expected alignment. No periprosthetic lucency or fracture. Recent postsurgical change includes air and edema in the soft tissues. IMPRESSION: Right hip arthroplasty without immediate postoperative complication. Electronically Signed   By: Narda Rutherford M.D.   On: 08/22/2022 17:31   DG Pelvis 1-2 Views  Result Date: 08/22/2022 CLINICAL DATA:  THA. EXAM: PELVIS - 1-2 VIEW COMPARISON:  Hip radiograph yesterday. FINDINGS: Two frontal views of the pelvis obtained in the operating room. Image time  stamped 1516 hour demonstrates portions of a right hip arthroplasty in place. Image time stamped 1531 hour demonstrates right hip arthroplasty in expected alignment. IMPRESSION: Frontal views of the pelvis obtained during right hip arthroplasty. Electronically Signed   By: Narda Rutherford M.D.   On: 08/22/2022 15:51   DG Knee Right Port  Result Date: 08/22/2022 CLINICAL DATA:  Fall.  History of right knee arthroplasty. EXAM: PORTABLE RIGHT KNEE - 1-2 VIEW COMPARISON:  None Available. FINDINGS: No fracture or bone lesion. The femoral and tibial prosthetic components appear well seated and aligned. No evidence of loosening. Skeletal structures are demineralized. Soft tissues are unremarkable.  No convincing joint effusion. IMPRESSION: 1. No fracture or acute finding. 2. No evidence of loosening of the arthroplasty components. Electronically Signed   By: Amie Portland M.D.   On: 08/22/2022 09:28        Scheduled Meds:  aspirin EC  81 mg Oral BID   cholecalciferol  2,000 Units Oral BID   docusate sodium  100 mg Oral BID   feeding supplement  237 mL Oral TID BM   ketorolac  7.5 mg Intravenous Q8H   multivitamin with minerals  1 tablet Oral Daily   Vitamin D (Ergocalciferol)  50,000 Units Oral Q7 days   Continuous Infusions:  sodium chloride       LOS: 2 days   Time spent= 35 mins    Devyn Sheerin Joline Maxcy, MD [facility]  If 7PM-7AM, please contact night-coverage  08/24/2022, 7:50 AM

## 2022-08-25 DIAGNOSIS — S72001A Fracture of unspecified part of neck of right femur, initial encounter for closed fracture: Secondary | ICD-10-CM | POA: Diagnosis not present

## 2022-08-25 LAB — BASIC METABOLIC PANEL
Anion gap: 8 (ref 5–15)
BUN: 10 mg/dL (ref 6–20)
CO2: 23 mmol/L (ref 22–32)
Calcium: 8 mg/dL — ABNORMAL LOW (ref 8.9–10.3)
Chloride: 99 mmol/L (ref 98–111)
Creatinine, Ser: 0.67 mg/dL (ref 0.44–1.00)
GFR, Estimated: >60 mL/min (ref >60)
Glucose, Bld: 89 mg/dL (ref 70–99)
Potassium: 4.2 mmol/L (ref 3.5–5.1)
Sodium: 130 mmol/L — ABNORMAL LOW (ref 135–145)

## 2022-08-25 LAB — CBC
HCT: 25 % — ABNORMAL LOW (ref 36.0–46.0)
Hemoglobin: 8.7 g/dL — ABNORMAL LOW (ref 12.0–15.0)
MCH: 36.1 pg — ABNORMAL HIGH (ref 26.0–34.0)
MCHC: 34.8 g/dL (ref 30.0–36.0)
MCV: 103.7 fL — ABNORMAL HIGH (ref 80.0–100.0)
Platelets: 155 10*3/uL (ref 150–400)
RBC: 2.41 MIL/uL — ABNORMAL LOW (ref 3.87–5.11)
RDW: 12.1 % (ref 11.5–15.5)
WBC: 6.6 10*3/uL (ref 4.0–10.5)
nRBC: 0 % (ref 0.0–0.2)

## 2022-08-25 LAB — MAGNESIUM: Magnesium: 1.8 mg/dL (ref 1.7–2.4)

## 2022-08-25 LAB — SODIUM: Sodium: 129 mmol/L — ABNORMAL LOW (ref 135–145)

## 2022-08-25 MED ORDER — ONDANSETRON HCL 4 MG PO TABS: 4.0000 mg | ORAL_TABLET | Freq: Three times a day (TID) | ORAL | 0 refills | Status: AC | PRN

## 2022-08-25 NOTE — Discharge Summary (Signed)
Physician Discharge Summary  Madison Malone ZHG:[id number] DOB: 1966-11-14 DOA: 08/22/2022  PCP: Lauro Regulus, MD  Admit date: 08/22/2022 Discharge date: 08/25/2022  Admitted From: Home Disposition:  HOme  Recommendations for Outpatient Follow-up:  Follow up with PCP in 1-2 weeks Please obtain BMP/CBC in one week your next doctors visit.  Outpatient follow-up with orthopedic in 2 weeks Aspirin 81 mg twice daily for DVT prophylaxis Dressing-Aquacel, keep intact until follow-up Weightbearing as tolerated of the right lower extremity, no formal hip precautions. Pain medications with bowel regimen prescribed Vitamin D supplements   Discharge Condition: Stable CODE STATUS: Full code Diet recommendation: Regular  Brief/Interim Summary: 56 y.o. female with medical history significant of smoking, chronic low back pain, arthritis.  Apparently osteoporosis per PCP records. Pt presents to ED at [facility] with c/o R hip pain following mechanical fall.  Tripped over her dog's leash, fell on R hip.  Patient transferred here per orthopedic request for further management due to lack of service there.  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  Postop course complicated by hyponatremia which was secondary to dehydration and improved with IV fluids.  Rest of the recommendations as mentioned above.     Assessment & Plan:  Principal Problem:   Closed displaced fracture of right femoral neck (HCC) Active Problems:   Osteoporosis       Assessment and Plan: * Closed displaced fracture of right femoral neck (HCC) Transferred from [facility] for surgical management.  Status post right-sided THA on 9/2.  Doing much better.  Pain management at this time.  Aspirin twice daily for DVT prophylaxis.  Outpatient follow-up with orthopedic in 2 weeks.  Dressing recommendations as mentioned above.  Weightbearing as tolerated. Seen by PT, HH arranged.    Hyponatremia - Resolved  with IV fluids   Osteoporosis Vitamin D deficiency Supportive care.  Started on vitamin D supplements      Discharge Diagnoses:  Principal Problem:   Closed displaced fracture of right femoral neck (HCC) Active Problems:   Osteoporosis   Vitamin D deficiency      Consultations: Orthopedic  Subjective: Doing well no complaints  Discharge Exam: Vitals:   08/25/22 0336 08/25/22 0727  BP: 103/64 137/68  Pulse: (!) 102 100  Resp: 17   Temp: 98 F (36.7 C) 98.7 F (37.1 C)  SpO2: 96% 100%   Vitals:   08/24/22 1523 08/24/22 1915 08/25/22 0336 08/25/22 0727  BP: 110/82 (!) 107/44 103/64 137/68  Pulse: 94 (!) 107 (!) 102 100  Resp:  17 17   Temp: 97.8 F (36.6 C) 98.2 F (36.8 C) 98 F (36.7 C) 98.7 F (37.1 C)  TempSrc: Oral   Oral  SpO2: 100% 93% 96% 100%  Weight:      Height:        General: Pt is alert, awake, not in acute distress Cardiovascular: RRR, S1/S2 +, no rubs, no gallops Respiratory: CTA bilaterally, no wheezing, no rhonchi Abdominal: Soft, NT, ND, bowel sounds + Extremities: no edema, no cyanosis  Discharge Instructions   Allergies as of 08/25/2022       Reactions   Ibuprofen Other (See Comments)   Heartburn    Morphine Itching, Other (See Comments)   Not effective. Insomnia, hyperactive         Medication List     TAKE these medications    acetaminophen 325 MG tablet Commonly known as: TYLENOL Take 1-2 tablets (325-650 mg total) by mouth every 8 (  eight) hours as needed for mild pain, fever, headache or moderate pain. What changed:  medication strength how much to take when to take this reasons to take this   aspirin EC 81 MG tablet Take 1 tablet (81 mg total) by mouth 2 (two) times daily. Swallow whole.   cyclobenzaprine 10 MG tablet Commonly known as: FLEXERIL Take 20 mg by mouth at bedtime.   diclofenac Sodium 1 % Gel Commonly known as: VOLTAREN Apply 1 Application topically as needed (pain).   docusate sodium  100 MG capsule Commonly known as: COLACE Take 1 capsule (100 mg total) by mouth 2 (two) times daily.   ondansetron 4 MG tablet Commonly known as: Zofran Take 1 tablet (4 mg total) by mouth every 8 (eight) hours as needed for up to 14 days for nausea or vomiting.   oxyCODONE-acetaminophen 5-325 MG tablet Commonly known as: PERCOCET/ROXICET Take 1-2 tablets by mouth every 6 (six) hours as needed for severe pain or moderate pain.   Vitamin D (Ergocalciferol) 1.25 MG (50000 UNIT) Caps capsule Commonly known as: DRISDOL Take 1 capsule (50,000 Units total) by mouth every 7 (seven) days. Start taking on: August 30, 2022   vitamin D3 25 MCG tablet Commonly known as: CHOLECALCIFEROL Take 2 tablets (2,000 Units total) by mouth 2 (two) times daily.        Follow-up Information     Joen Laura, MD Follow up in 2 week(s).   Specialty: Orthopedic Surgery Contact information: [address] [place] [address] [phone]         Lauro Regulus, MD Follow up in 1 week(s).   Specialty: Internal Medicine Contact information: [address] [facility] [place] [address] [phone]                Allergies  Allergen Reactions   Ibuprofen Other (See Comments)    Heartburn    Morphine Itching and Other (See Comments)    Not effective. Insomnia, hyperactive     You were cared for by a hospitalist during your hospital stay. If you have any questions about your discharge medications or the care you received while you were in the hospital after you are discharged, you can call the unit and asked to speak with the hospitalist on call if the hospitalist that took care of you is not available. Once you are discharged, your primary care physician will handle any further medical issues. Please note that no refills for any discharge medications will be authorized once you are discharged, as it is imperative that you return to your  primary care physician (or establish a relationship with a primary care physician if you do not have one) for your aftercare needs so that they can reassess your need for medications and monitor your lab values.   Procedures/Studies: DG HIP UNILAT W OR W/O PELVIS 2-3 VIEWS RIGHT  Result Date: 08/22/2022 CLINICAL DATA:  Postop total hip arthroplasty. EXAM: DG HIP (WITH OR WITHOUT PELVIS) 2-3V RIGHT COMPARISON:  Preoperative radiograph yesterday. FINDINGS: Right hip arthroplasty in expected alignment. No periprosthetic lucency or fracture. Recent postsurgical change includes air and edema in the soft tissues. IMPRESSION: Right hip arthroplasty without immediate postoperative complication. Electronically Signed   By: Narda Rutherford M.D.   On: 08/22/2022 17:31   DG Pelvis 1-2 Views  Result Date: 08/22/2022 CLINICAL DATA:  THA. EXAM: PELVIS - 1-2 VIEW COMPARISON:  Hip radiograph yesterday. FINDINGS: Two frontal views of the pelvis  obtained in the operating room. Image time stamped 1516 hour demonstrates portions of a right hip arthroplasty in place. Image time stamped 1531 hour demonstrates right hip arthroplasty in expected alignment. IMPRESSION: Frontal views of the pelvis obtained during right hip arthroplasty. Electronically Signed   By: Narda Rutherford M.D.   On: 08/22/2022 15:51   DG Knee Right Port  Result Date: 08/22/2022 CLINICAL DATA:  Fall.  History of right knee arthroplasty. EXAM: PORTABLE RIGHT KNEE - 1-2 VIEW COMPARISON:  None Available. FINDINGS: No fracture or bone lesion. The femoral and tibial prosthetic components appear well seated and aligned. No evidence of loosening. Skeletal structures are demineralized. Soft tissues are unremarkable.  No convincing joint effusion. IMPRESSION: 1. No fracture or acute finding. 2. No evidence of loosening of the arthroplasty components. Electronically Signed   By: Amie Portland M.D.   On: 08/22/2022 09:28   DG Chest 1 View  Result Date:  08/21/2022 CLINICAL DATA:  Post fall with right hip injury. EXAM: CHEST  1 VIEW COMPARISON:  None Available. FINDINGS: The cardiomediastinal contours are normal. The lungs are clear. Pulmonary vasculature is normal. No consolidation, pleural effusion, or pneumothorax. No acute osseous abnormalities are seen. IMPRESSION: No acute chest findings. Electronically Signed   By: Narda Rutherford M.D.   On: 08/21/2022 22:32   DG Hip Unilat  With Pelvis 2-3 Views Right  Result Date: 08/21/2022 CLINICAL DATA:  Post fall with right hip injury. EXAM: DG HIP (WITH OR WITHOUT PELVIS) 2-3V RIGHT COMPARISON:  None Available. FINDINGS: Displaced right femoral neck fracture. There is proximal migration of the femoral shaft. The femoral head remains seated. The pubic rami are intact. No additional fracture of the pelvis. The pubic symphysis and sacroiliac joints are congruent. IMPRESSION: Displaced right femoral neck fracture. Electronically Signed   By: Narda Rutherford M.D.   On: 08/21/2022 22:31     The results of significant diagnostics from this hospitalization (including imaging, microbiology, ancillary and laboratory) are listed below for reference.     Microbiology: Recent Results (from the past 240 hour(s))  Surgical PCR screen     Status: None   Collection Time: 08/22/22 11:31 AM   Specimen: Nasal Mucosa; Nasal Swab  Result Value Ref Range Status   MRSA, PCR NEGATIVE NEGATIVE Final   Staphylococcus aureus NEGATIVE NEGATIVE Final    Comment: (NOTE) The Xpert SA Assay (FDA approved for NASAL specimens in patients 36 years of age and older), is one component of a comprehensive surveillance program. It is not intended to diagnose infection nor to guide or monitor treatment. Performed at [facility], [address] [place], [place] [address]      Labs: BNP (last 3 results) No results for input(s): "BNP" in the last 8760 hours. Basic Metabolic Panel: Recent Labs  Lab 08/22/22 0015  08/23/22 0051 08/24/22 0054 08/24/22 1148 08/24/22 1818 08/25/22 0001 08/25/22 0541  NA 136 129* 124* 127* 128* 129* 130*  K 3.4* 3.7 4.3  --   --   --  4.2  CL 104 96* 90*  --   --   --  99  CO2 21* 24 25  --   --   --  23  GLUCOSE 95 100* 95  --   --   --  89  BUN 16 6 5*  --   --   --  10  CREATININE 0.60 0.60 0.61  --   --   --  0.67  CALCIUM 8.8* 8.4*  8.4*  --   --   --  8.0*  MG  --  1.6* 2.0  --   --   --  1.8   Liver Function Tests: Recent Labs  Lab 08/22/22 0015  AST 21  ALT 18  ALKPHOS 58  BILITOT <0.1*  PROT 7.2  ALBUMIN 4.1   No results for input(s): "LIPASE", "AMYLASE" in the last 168 hours. No results for input(s): "AMMONIA" in the last 168 hours. CBC: Recent Labs  Lab 08/22/22 0015 08/23/22 0051 08/24/22 0054 08/25/22 0541  WBC 7.7 5.5 7.6 6.6  NEUTROABS 6.2  --   --   --   HGB 12.8 11.0* 10.5* 8.7*  HCT 38.0 32.2* 30.2* 25.0*  MCV 104.1* 102.2* 101.7* 103.7*  PLT 256 193 183 155   Cardiac Enzymes: No results for input(s): "CKTOTAL", "CKMB", "CKMBINDEX", "TROPONINI" in the last 168 hours. BNP: Invalid input(s): "POCBNP" CBG: No results for input(s): "GLUCAP" in the last 168 hours. D-Dimer No results for input(s): "DDIMER" in the last 72 hours. Hgb A1c No results for input(s): "HGBA1C" in the last 72 hours. Lipid Profile No results for input(s): "CHOL", "HDL", "LDLCALC", "TRIG", "CHOLHDL", "LDLDIRECT" in the last 72 hours. Thyroid function studies No results for input(s): "TSH", "T4TOTAL", "T3FREE", "THYROIDAB" in the last 72 hours.  Invalid input(s): "FREET3" Anemia work up No results for input(s): "VITAMINB12", "FOLATE", "FERRITIN", "TIBC", "IRON", "RETICCTPCT" in the last 72 hours. Urinalysis No results found for: "COLORURINE", "APPEARANCEUR", "LABSPEC", "PHURINE", "GLUCOSEU", "HGBUR", "BILIRUBINUR", "KETONESUR", "PROTEINUR", "UROBILINOGEN", "NITRITE", "LEUKOCYTESUR" Sepsis Labs Recent Labs  Lab 08/22/22 0015 08/23/22 0051  08/24/22 0054 08/25/22 0541  WBC 7.7 5.5 7.6 6.6   Microbiology Recent Results (from the past 240 hour(s))  Surgical PCR screen     Status: None   Collection Time: 08/22/22 11:31 AM   Specimen: Nasal Mucosa; Nasal Swab  Result Value Ref Range Status   MRSA, PCR NEGATIVE NEGATIVE Final   Staphylococcus aureus NEGATIVE NEGATIVE Final    Comment: (NOTE) The Xpert SA Assay (FDA approved for NASAL specimens in patients 79 years of age and older), is one component of a comprehensive surveillance program. It is not intended to diagnose infection nor to guide or monitor treatment. Performed at [facility], [address] [place], [place] [address]      Time coordinating discharge:  I have spent 35 minutes face to face with the patient and on the ward discussing the patients care, assessment, plan and disposition with other care givers. >50% of the time was devoted counseling the patient about the risks and benefits of treatment/Discharge disposition and coordinating care.   SIGNED:   Dimple Nanas, MD  [facility] 08/25/2022, 8:15 AM   If 7PM-7AM, please contact night-coverage

## 2022-08-25 NOTE — TOC Transition Note (Signed)
Transition of Care [facility]) - CM/SW Discharge Note   Patient Details  Name: Madison Malone MRN: [id number] Date of Birth: 12/23/1965  Transition of Care [facility]) CM/SW Contact:  Epifanio Lesches, RN Phone Number: 08/25/2022, 10:44 AM   Clinical Narrative:    Patient will DC to: Home  Anticipated DC date: 08/25/2022 Family notified: yes Transport by: car                - s/p R THA, 9/2  Per MD patient ready for DC today. RN, patient, patient's family aware of DC. Pt states family/ friends to assist with care iif needed once d/c. Recommendations noted from PT for outpatient PT services. Pt states unable to get to outpatient PT center 2/2 no transportation. States preference: home with home health services. NCM has requested order from MD. Referral made with [facility] and accepted pending MD's order.  Pt without DME needs, already with [facility] and  RW @ home. Post hospital f/u noted on AVS.  Friend to provide transportation to home.  RNCM will sign off for now as intervention is no longer needed. Please consult [place] again if new needs arise.    Final next level of care: Home w Home Health Services Barriers to Discharge: No Barriers Identified   Patient Goals and CMS Choice     Choice offered to / list presented to : Patient  Discharge Placement                       Discharge Plan and Services                          HH Arranged: PT [facility] Agency: [facility] Date [facility] Agency Contacted: 08/25/22 Time HH Agency Contacted: 1033 Representative spoke with at [facility] Agency: Tresa Endo  Social Determinants of Health (SDOH) Interventions     Readmission Risk Interventions     No data to display

## 2022-08-25 NOTE — Progress Notes (Signed)
Physical Therapy Treatment Patient Details Name: Madison Malone MRN: [id number] DOB: 1966-02-09 Today's Date: 08/25/2022   History of Present Illness Madison Malone is a 56 y.o. female with medical history significant of smoking, chronic low back pain, arthritis.  Apparently osteoporosis per PCP records. Pt presents to ED at [facility] with c/o R hip pain following mechanical fall.  Tripped over her dog's leash, fell on R hip. Pt is s/p R THA 2/2 closed displaced fracture of right femoral neck.    PT Comments    Pt agreeable to treatment and was premedicated for pain. Pt completed gait and stair training. Pt with excellent performance of tasks. Pt ready for discharge from physical therapy standpoint.    Recommendations for follow up therapy are one component of a multi-disciplinary discharge planning process, led by the attending physician.  Recommendations may be updated based on patient status, additional functional criteria and insurance authorization.  Follow Up Recommendations  Outpatient PT     Assistance Recommended at Discharge Intermittent Supervision/Assistance  Patient can return home with the following A little help with walking and/or transfers;A little help with bathing/dressing/bathroom;Help with stairs or ramp for entrance;Assist for transportation;Assistance with cooking/housework   Equipment Recommendations  BSC/3in1    Recommendations for Other Services       Precautions / Restrictions Precautions Precautions: Fall Precaution Comments: no hip precautions Restrictions Weight Bearing Restrictions: Yes RLE Weight Bearing: Weight bearing as tolerated     Mobility  Bed Mobility Overal bed mobility: Modified Independent                  Transfers Overall transfer level: Modified independent Equipment used: Rolling walker (2 wheels) Transfers: Sit to/from Stand                  Ambulation/Gait Ambulation/Gait assistance: Min guard Gait  Distance (Feet): 250 Feet Assistive device: Rolling walker (2 wheels) Gait Pattern/deviations: Decreased stance time - right, Decreased step length - left, Decreased step length - right, Antalgic, Trunk flexed, Step-through pattern Gait velocity: decreased Gait velocity interpretation: 1.31 - 2.62 ft/sec, indicative of limited community ambulator   General Gait Details: Pt performed modified three point pattern as instructed. Pt required cues for increasing R ankle DF in swing phase of gait.   Stairs Stairs: Yes Stairs assistance: Min guard, Supervision Stair Management: One rail Left, One rail Right, Forwards, With walker Number of Stairs: 3 General stair comments: Pt ascended and descended 3 stairs respectively. Pt utilized R rail and walker (forwards position) going up and L rail and walker going down. No unsteadiness or LOB.   Wheelchair Mobility    Modified Rankin (Stroke Patients Only)       Balance Overall balance assessment: Needs assistance   Sitting balance-Leahy Scale: Good     Standing balance support: Bilateral upper extremity supported, No upper extremity supported Standing balance-Leahy Scale: Fair                              Cognition Arousal/Alertness: Awake/alert Behavior During Therapy: Impulsive Overall Cognitive Status: Within Functional Limits for tasks assessed                                 General Comments: Pt impulsive due to her independent nature.        Exercises      General Comments  Pertinent Vitals/Pain Pain Assessment Pain Assessment: 0-10 Pain Score: 9  Pain Location: R hip Pain Descriptors / Indicators: Sharp, Burning Pain Intervention(s): Premedicated before session, Monitored during session, Limited activity within patient's tolerance, Ice applied    Home Living                          Prior Function            PT Goals (current goals can now be found in the care plan  section) Acute Rehab PT Goals Patient Stated Goal: none stated PT Goal Formulation: With patient Time For Goal Achievement: 09/04/22 Potential to Achieve Goals: Good Progress towards PT goals: Progressing toward goals    Frequency    Min 5X/week      PT Plan Current plan remains appropriate    Co-evaluation              AM-PAC PT "6 Clicks" Mobility   Outcome Measure  Help needed turning from your back to your side while in a flat bed without using bedrails?: None Help needed moving from lying on your back to sitting on the side of a flat bed without using bedrails?: None Help needed moving to and from a bed to a chair (including a wheelchair)?: None Help needed standing up from a chair using your arms (e.g., wheelchair or bedside chair)?: None Help needed to walk in hospital room?: A Little Help needed climbing 3-5 steps with a railing? : A Little 6 Click Score: 22    End of Session Equipment Utilized During Treatment: Gait belt Activity Tolerance: Patient limited by pain Patient left: with call bell/phone within reach;in bed Nurse Communication: Mobility status PT Visit Diagnosis: Other abnormalities of gait and mobility (R26.89);Muscle weakness (generalized) (M62.81);Pain Pain - Right/Left: Right Pain - part of body: Hip     Time: 0830-0900 PT Time Calculation (min) (ACUTE ONLY): 30 min  Charges:  $Gait Training: 8-22 mins $Therapeutic Activity: 8-22 mins                     Tana Coast, PT    [employer] 08/25/2022, 9:33 AM

## 2022-08-25 NOTE — Progress Notes (Signed)
     Subjective: Patient in good spirits today. Worked very well with PT yesterday ambulating 122ft. She is hopeful to go home. Denies distal n/t. Hyponatermia improving now 130 this AM. Denies distal n/t.  Objective:   VITALS:   Vitals:   08/24/22 0746 08/24/22 1523 08/24/22 1915 08/25/22 0336  BP: 112/64 110/82 (!) 107/44 103/64  Pulse: (!) 110 94 (!) 107 (!) 102  Resp:   17 17  Temp: 99.9 F (37.7 C) 97.8 F (36.6 C) 98.2 F (36.8 C) 98 F (36.7 C)  TempSrc: Oral Oral    SpO2: 99% 100% 93% 96%  Weight:      Height:        Sensation intact distally Intact pulses distally Dorsiflexion/Plantar flexion intact Incision: dressing C/D/I Compartment soft   Lab Results  Component Value Date   WBC 6.6 08/25/2022   HGB 8.7 (L) 08/25/2022   HCT 25.0 (L) 08/25/2022   MCV 103.7 (H) 08/25/2022   PLT 155 08/25/2022   BMET    Component Value Date/Time   NA 130 (L) 08/25/2022 0541   K 4.2 08/25/2022 0541   CL 99 08/25/2022 0541   CO2 23 08/25/2022 0541   GLUCOSE 89 08/25/2022 0541   BUN 10 08/25/2022 0541   CREATININE 0.67 08/25/2022 0541   CALCIUM 8.0 (L) 08/25/2022 0541   GFRNONAA >60 08/25/2022 0541      Xray: post op xrays demonstrate THA components good position no adverse features  Assessment/Plan: 3 Days Post-Op   Principal Problem:   Closed displaced fracture of right femoral neck (HCC) Active Problems:   Osteoporosis   Vitamin D deficiency  S/p R THA for FN Fx 9/2  Post op recs: WB: WBAT RLE, No formal hip precautions Abx: ancef Imaging: PACU pelvis Xray Dressing: Aquacell, keep intact until follow up DVT prophylaxis: Aspirin 81BID starting POD1 Follow up: 2 weeks after surgery for a wound check with Dr. Blanchie Dessert at [facility].  Address: [address], [place], [place] [address]  Office Phone: [phone]    Madison Malone 08/25/2022, 6:53 AM   Weber Cooks, MD  Contact information:   [phone] 7am-5pm  epic message Dr. Blanchie Dessert, or call office for patient follow up: [phone] After hours and holidays please check [email or web address] for group call information for [facility]

## 2022-08-25 NOTE — Plan of Care (Signed)

## 2022-08-26 ENCOUNTER — Encounter (HOSPITAL_COMMUNITY): Payer: Self-pay | Admitting: Orthopedic Surgery

## 2022-08-28 DIAGNOSIS — M1990 Unspecified osteoarthritis, unspecified site: Secondary | ICD-10-CM | POA: Diagnosis not present

## 2022-08-28 DIAGNOSIS — E559 Vitamin D deficiency, unspecified: Secondary | ICD-10-CM | POA: Diagnosis not present

## 2022-08-28 DIAGNOSIS — M545 Low back pain, unspecified: Secondary | ICD-10-CM | POA: Diagnosis not present

## 2022-08-28 DIAGNOSIS — M810 Age-related osteoporosis without current pathological fracture: Secondary | ICD-10-CM | POA: Diagnosis not present

## 2022-08-28 DIAGNOSIS — S72001D Fracture of unspecified part of neck of right femur, subsequent encounter for closed fracture with routine healing: Secondary | ICD-10-CM | POA: Diagnosis not present

## 2022-08-28 DIAGNOSIS — R69 Illness, unspecified: Secondary | ICD-10-CM | POA: Diagnosis not present

## 2022-08-28 DIAGNOSIS — G8929 Other chronic pain: Secondary | ICD-10-CM | POA: Diagnosis not present

## 2022-08-28 DIAGNOSIS — Z9181 History of falling: Secondary | ICD-10-CM | POA: Diagnosis not present

## 2022-08-28 DIAGNOSIS — Z96641 Presence of right artificial hip joint: Secondary | ICD-10-CM | POA: Diagnosis not present

## 2022-08-28 DIAGNOSIS — K219 Gastro-esophageal reflux disease without esophagitis: Secondary | ICD-10-CM | POA: Diagnosis not present

## 2022-09-01 DIAGNOSIS — Z96641 Presence of right artificial hip joint: Secondary | ICD-10-CM | POA: Diagnosis not present

## 2022-09-01 DIAGNOSIS — S72001D Fracture of unspecified part of neck of right femur, subsequent encounter for closed fracture with routine healing: Secondary | ICD-10-CM | POA: Diagnosis not present

## 2022-09-01 DIAGNOSIS — E559 Vitamin D deficiency, unspecified: Secondary | ICD-10-CM | POA: Diagnosis not present

## 2022-09-01 DIAGNOSIS — G8929 Other chronic pain: Secondary | ICD-10-CM | POA: Diagnosis not present

## 2022-09-01 DIAGNOSIS — M545 Low back pain, unspecified: Secondary | ICD-10-CM | POA: Diagnosis not present

## 2022-09-01 DIAGNOSIS — K219 Gastro-esophageal reflux disease without esophagitis: Secondary | ICD-10-CM | POA: Diagnosis not present

## 2022-09-01 DIAGNOSIS — M1990 Unspecified osteoarthritis, unspecified site: Secondary | ICD-10-CM | POA: Diagnosis not present

## 2022-09-01 DIAGNOSIS — M810 Age-related osteoporosis without current pathological fracture: Secondary | ICD-10-CM | POA: Diagnosis not present

## 2022-09-01 DIAGNOSIS — R69 Illness, unspecified: Secondary | ICD-10-CM | POA: Diagnosis not present

## 2022-09-01 DIAGNOSIS — Z9181 History of falling: Secondary | ICD-10-CM | POA: Diagnosis not present

## 2022-09-03 DIAGNOSIS — K219 Gastro-esophageal reflux disease without esophagitis: Secondary | ICD-10-CM | POA: Diagnosis not present

## 2022-09-03 DIAGNOSIS — G8929 Other chronic pain: Secondary | ICD-10-CM | POA: Diagnosis not present

## 2022-09-03 DIAGNOSIS — M810 Age-related osteoporosis without current pathological fracture: Secondary | ICD-10-CM | POA: Diagnosis not present

## 2022-09-03 DIAGNOSIS — R69 Illness, unspecified: Secondary | ICD-10-CM | POA: Diagnosis not present

## 2022-09-03 DIAGNOSIS — S72001D Fracture of unspecified part of neck of right femur, subsequent encounter for closed fracture with routine healing: Secondary | ICD-10-CM | POA: Diagnosis not present

## 2022-09-03 DIAGNOSIS — E559 Vitamin D deficiency, unspecified: Secondary | ICD-10-CM | POA: Diagnosis not present

## 2022-09-03 DIAGNOSIS — M1990 Unspecified osteoarthritis, unspecified site: Secondary | ICD-10-CM | POA: Diagnosis not present

## 2022-09-03 DIAGNOSIS — Z9181 History of falling: Secondary | ICD-10-CM | POA: Diagnosis not present

## 2022-09-03 DIAGNOSIS — Z96641 Presence of right artificial hip joint: Secondary | ICD-10-CM | POA: Diagnosis not present

## 2022-09-03 DIAGNOSIS — M545 Low back pain, unspecified: Secondary | ICD-10-CM | POA: Diagnosis not present

## 2022-09-04 DIAGNOSIS — R7303 Prediabetes: Secondary | ICD-10-CM | POA: Diagnosis not present

## 2022-09-04 DIAGNOSIS — M810 Age-related osteoporosis without current pathological fracture: Secondary | ICD-10-CM | POA: Diagnosis not present

## 2022-09-05 DIAGNOSIS — S72001D Fracture of unspecified part of neck of right femur, subsequent encounter for closed fracture with routine healing: Secondary | ICD-10-CM | POA: Diagnosis not present

## 2022-09-05 DIAGNOSIS — E559 Vitamin D deficiency, unspecified: Secondary | ICD-10-CM | POA: Diagnosis not present

## 2022-09-05 DIAGNOSIS — M545 Low back pain, unspecified: Secondary | ICD-10-CM | POA: Diagnosis not present

## 2022-09-05 DIAGNOSIS — Z9181 History of falling: Secondary | ICD-10-CM | POA: Diagnosis not present

## 2022-09-05 DIAGNOSIS — M810 Age-related osteoporosis without current pathological fracture: Secondary | ICD-10-CM | POA: Diagnosis not present

## 2022-09-05 DIAGNOSIS — Z96641 Presence of right artificial hip joint: Secondary | ICD-10-CM | POA: Diagnosis not present

## 2022-09-05 DIAGNOSIS — M1990 Unspecified osteoarthritis, unspecified site: Secondary | ICD-10-CM | POA: Diagnosis not present

## 2022-09-05 DIAGNOSIS — G8929 Other chronic pain: Secondary | ICD-10-CM | POA: Diagnosis not present

## 2022-09-05 DIAGNOSIS — R69 Illness, unspecified: Secondary | ICD-10-CM | POA: Diagnosis not present

## 2022-09-05 DIAGNOSIS — K219 Gastro-esophageal reflux disease without esophagitis: Secondary | ICD-10-CM | POA: Diagnosis not present

## 2022-09-09 DIAGNOSIS — G8929 Other chronic pain: Secondary | ICD-10-CM | POA: Diagnosis not present

## 2022-09-09 DIAGNOSIS — Z96641 Presence of right artificial hip joint: Secondary | ICD-10-CM | POA: Diagnosis not present

## 2022-09-09 DIAGNOSIS — K219 Gastro-esophageal reflux disease without esophagitis: Secondary | ICD-10-CM | POA: Diagnosis not present

## 2022-09-09 DIAGNOSIS — E559 Vitamin D deficiency, unspecified: Secondary | ICD-10-CM | POA: Diagnosis not present

## 2022-09-09 DIAGNOSIS — M1990 Unspecified osteoarthritis, unspecified site: Secondary | ICD-10-CM | POA: Diagnosis not present

## 2022-09-09 DIAGNOSIS — M810 Age-related osteoporosis without current pathological fracture: Secondary | ICD-10-CM | POA: Diagnosis not present

## 2022-09-09 DIAGNOSIS — M545 Low back pain, unspecified: Secondary | ICD-10-CM | POA: Diagnosis not present

## 2022-09-09 DIAGNOSIS — Z9181 History of falling: Secondary | ICD-10-CM | POA: Diagnosis not present

## 2022-09-09 DIAGNOSIS — S72001D Fracture of unspecified part of neck of right femur, subsequent encounter for closed fracture with routine healing: Secondary | ICD-10-CM | POA: Diagnosis not present

## 2022-09-09 DIAGNOSIS — R69 Illness, unspecified: Secondary | ICD-10-CM | POA: Diagnosis not present

## 2022-09-11 DIAGNOSIS — Z9181 History of falling: Secondary | ICD-10-CM | POA: Diagnosis not present

## 2022-09-11 DIAGNOSIS — Z96641 Presence of right artificial hip joint: Secondary | ICD-10-CM | POA: Diagnosis not present

## 2022-09-11 DIAGNOSIS — R69 Illness, unspecified: Secondary | ICD-10-CM | POA: Diagnosis not present

## 2022-09-11 DIAGNOSIS — M545 Low back pain, unspecified: Secondary | ICD-10-CM | POA: Diagnosis not present

## 2022-09-11 DIAGNOSIS — K219 Gastro-esophageal reflux disease without esophagitis: Secondary | ICD-10-CM | POA: Diagnosis not present

## 2022-09-11 DIAGNOSIS — M810 Age-related osteoporosis without current pathological fracture: Secondary | ICD-10-CM | POA: Diagnosis not present

## 2022-09-11 DIAGNOSIS — S72001D Fracture of unspecified part of neck of right femur, subsequent encounter for closed fracture with routine healing: Secondary | ICD-10-CM | POA: Diagnosis not present

## 2022-09-11 DIAGNOSIS — G8929 Other chronic pain: Secondary | ICD-10-CM | POA: Diagnosis not present

## 2022-09-11 DIAGNOSIS — M1990 Unspecified osteoarthritis, unspecified site: Secondary | ICD-10-CM | POA: Diagnosis not present

## 2022-09-11 DIAGNOSIS — E559 Vitamin D deficiency, unspecified: Secondary | ICD-10-CM | POA: Diagnosis not present

## 2022-09-15 DIAGNOSIS — G8929 Other chronic pain: Secondary | ICD-10-CM | POA: Diagnosis not present

## 2022-09-15 DIAGNOSIS — M545 Low back pain, unspecified: Secondary | ICD-10-CM | POA: Diagnosis not present

## 2022-09-15 DIAGNOSIS — Z9181 History of falling: Secondary | ICD-10-CM | POA: Diagnosis not present

## 2022-09-15 DIAGNOSIS — Z96641 Presence of right artificial hip joint: Secondary | ICD-10-CM | POA: Diagnosis not present

## 2022-09-15 DIAGNOSIS — S72001D Fracture of unspecified part of neck of right femur, subsequent encounter for closed fracture with routine healing: Secondary | ICD-10-CM | POA: Diagnosis not present

## 2022-09-15 DIAGNOSIS — M810 Age-related osteoporosis without current pathological fracture: Secondary | ICD-10-CM | POA: Diagnosis not present

## 2022-09-15 DIAGNOSIS — M1990 Unspecified osteoarthritis, unspecified site: Secondary | ICD-10-CM | POA: Diagnosis not present

## 2022-09-15 DIAGNOSIS — R69 Illness, unspecified: Secondary | ICD-10-CM | POA: Diagnosis not present

## 2022-09-15 DIAGNOSIS — E559 Vitamin D deficiency, unspecified: Secondary | ICD-10-CM | POA: Diagnosis not present

## 2022-09-15 DIAGNOSIS — K219 Gastro-esophageal reflux disease without esophagitis: Secondary | ICD-10-CM | POA: Diagnosis not present

## 2022-09-16 DIAGNOSIS — G8929 Other chronic pain: Secondary | ICD-10-CM | POA: Diagnosis not present

## 2022-09-16 DIAGNOSIS — M545 Low back pain, unspecified: Secondary | ICD-10-CM | POA: Diagnosis not present

## 2022-09-16 DIAGNOSIS — R69 Illness, unspecified: Secondary | ICD-10-CM | POA: Diagnosis not present

## 2022-09-16 DIAGNOSIS — S72001D Fracture of unspecified part of neck of right femur, subsequent encounter for closed fracture with routine healing: Secondary | ICD-10-CM | POA: Diagnosis not present

## 2022-09-16 DIAGNOSIS — K219 Gastro-esophageal reflux disease without esophagitis: Secondary | ICD-10-CM | POA: Diagnosis not present

## 2022-09-16 DIAGNOSIS — M810 Age-related osteoporosis without current pathological fracture: Secondary | ICD-10-CM | POA: Diagnosis not present

## 2022-09-16 DIAGNOSIS — Z96641 Presence of right artificial hip joint: Secondary | ICD-10-CM | POA: Diagnosis not present

## 2022-09-16 DIAGNOSIS — E559 Vitamin D deficiency, unspecified: Secondary | ICD-10-CM | POA: Diagnosis not present

## 2022-09-16 DIAGNOSIS — M1990 Unspecified osteoarthritis, unspecified site: Secondary | ICD-10-CM | POA: Diagnosis not present

## 2022-09-16 DIAGNOSIS — Z9181 History of falling: Secondary | ICD-10-CM | POA: Diagnosis not present

## 2022-09-18 DIAGNOSIS — M810 Age-related osteoporosis without current pathological fracture: Secondary | ICD-10-CM | POA: Diagnosis not present

## 2022-09-18 DIAGNOSIS — G8929 Other chronic pain: Secondary | ICD-10-CM | POA: Diagnosis not present

## 2022-09-18 DIAGNOSIS — K219 Gastro-esophageal reflux disease without esophagitis: Secondary | ICD-10-CM | POA: Diagnosis not present

## 2022-09-18 DIAGNOSIS — Z96641 Presence of right artificial hip joint: Secondary | ICD-10-CM | POA: Diagnosis not present

## 2022-09-18 DIAGNOSIS — M545 Low back pain, unspecified: Secondary | ICD-10-CM | POA: Diagnosis not present

## 2022-09-18 DIAGNOSIS — Z9181 History of falling: Secondary | ICD-10-CM | POA: Diagnosis not present

## 2022-09-18 DIAGNOSIS — E559 Vitamin D deficiency, unspecified: Secondary | ICD-10-CM | POA: Diagnosis not present

## 2022-09-18 DIAGNOSIS — M1990 Unspecified osteoarthritis, unspecified site: Secondary | ICD-10-CM | POA: Diagnosis not present

## 2022-09-18 DIAGNOSIS — R69 Illness, unspecified: Secondary | ICD-10-CM | POA: Diagnosis not present

## 2022-09-18 DIAGNOSIS — S72001D Fracture of unspecified part of neck of right femur, subsequent encounter for closed fracture with routine healing: Secondary | ICD-10-CM | POA: Diagnosis not present

## 2022-10-16 ENCOUNTER — Telehealth (HOSPITAL_COMMUNITY): Payer: Self-pay | Admitting: Orthopedic Surgery

## 2022-10-16 MED ORDER — OXYCODONE-ACETAMINOPHEN 5-325 MG PO TABS: 1.0000 | ORAL_TABLET | Freq: Four times a day (QID) | ORAL | 0 refills | Status: DC | PRN

## 2022-10-18 NOTE — Telephone Encounter (Signed)
Refill for post op percocet sent

## 2023-06-10 ENCOUNTER — Ambulatory Visit
Admission: RE | Admit: 2023-06-10 | Discharge: 2023-06-10 | Disposition: A | Discharge status: Home / Self Care | Payer: 59 | Attending: Endocrinology | Admitting: Endocrinology

## 2023-06-10 ENCOUNTER — Other Ambulatory Visit: Payer: Self-pay | Admitting: Endocrinology

## 2023-06-10 ENCOUNTER — Ambulatory Visit
Admission: RE | Admit: 2023-06-10 | Discharge: 2023-06-10 | Disposition: A | Discharge status: Home / Self Care | Payer: 59 | Source: Ambulatory Visit | Attending: Endocrinology | Admitting: Endocrinology

## 2023-06-10 DIAGNOSIS — R059 Cough, unspecified: Secondary | ICD-10-CM | POA: Insufficient documentation

## 2023-07-01 ENCOUNTER — Other Ambulatory Visit: Payer: Self-pay | Admitting: Internal Medicine

## 2023-07-01 DIAGNOSIS — Z1231 Encounter for screening mammogram for malignant neoplasm of breast: Secondary | ICD-10-CM

## 2023-07-02 ENCOUNTER — Other Ambulatory Visit: Payer: Self-pay | Admitting: Internal Medicine

## 2023-07-02 DIAGNOSIS — R911 Solitary pulmonary nodule: Secondary | ICD-10-CM

## 2023-07-05 ENCOUNTER — Other Ambulatory Visit: Payer: Self-pay | Admitting: Internal Medicine

## 2023-07-05 DIAGNOSIS — R911 Solitary pulmonary nodule: Secondary | ICD-10-CM

## 2023-07-08 ENCOUNTER — Ambulatory Visit
Admission: RE | Admit: 2023-07-08 | Discharge: 2023-07-08 | Disposition: A | Discharge status: Home / Self Care | Payer: 59 | Source: Ambulatory Visit | Attending: Internal Medicine | Admitting: Internal Medicine

## 2023-07-08 DIAGNOSIS — R911 Solitary pulmonary nodule: Secondary | ICD-10-CM

## 2023-07-08 MED ORDER — IOPAMIDOL (ISOVUE-300) INJECTION 61%
75.0000 mL | Freq: Once | INTRAVENOUS | Status: AC | PRN
  Administered 2023-07-08: 75 mL via INTRAVENOUS

## 2023-07-16 ENCOUNTER — Encounter: Payer: Self-pay | Admitting: *Deleted

## 2023-07-16 ENCOUNTER — Telehealth: Payer: Self-pay | Admitting: Oncology

## 2023-07-16 DIAGNOSIS — R911 Solitary pulmonary nodule: Secondary | ICD-10-CM

## 2023-07-16 NOTE — Progress Notes (Signed)
Referral received for lung nodule. Pt scheduled for new pt visit with Dr. Orlie Dakin on 07/21/23. Pt will require PET scan for further workup and biopsy planning. Will place orders and start authorization process. Will follow up with pt at new pt visit next week. Nothing further needed at this time.

## 2023-07-16 NOTE — Telephone Encounter (Signed)
Patient is a  57 year old fe/female who had a CT scan of the chest on July 08, 2023 that noted a right upper lobe 1 cm spiculated nodule highly suspicious for underlying bronchogenic carcinoma.  She has a history of tobacco use.     Patient has been referred to the [facility] and will require a PET scan for further evaluation and to optimize management.  PET scan is also needed to assist in further diagnostic studies including possible biopsy.

## 2023-07-21 ENCOUNTER — Inpatient Hospital Stay: Payer: 59

## 2023-07-21 ENCOUNTER — Encounter: Payer: Self-pay | Admitting: Oncology

## 2023-07-21 ENCOUNTER — Encounter: Payer: Self-pay | Admitting: *Deleted

## 2023-07-21 ENCOUNTER — Inpatient Hospital Stay: Payer: 59 | Attending: Oncology | Admitting: Oncology

## 2023-07-21 VITALS — BP 135/84 | HR 87 | Temp 97.8°F | Resp 16 | Ht 66.0 in | Wt 106.0 lb

## 2023-07-21 DIAGNOSIS — R911 Solitary pulmonary nodule: Secondary | ICD-10-CM

## 2023-07-21 NOTE — Progress Notes (Signed)
[facility]  Telephone:(336) [phone] Fax:(336) [phone]  ID: Madison Malone OB: 1966/07/06  MR#: [id number]  AOZ#:[id number]  Patient Care Team: Lauro Regulus, MD as PCP - General (Internal Medicine) Glory Buff, RN as Oncology Nurse Navigator  CHIEF COMPLAINT: Right upper lobe pulmonary nodule  INTERVAL HISTORY: Patient is a 57 year old female who was noted to have suspicious lesion on CT scan in her right upper lung lobe.  She was referred for further evaluation.  She currently feels well and is asymptomatic.  She has no neurologic complaints.  She denies any recent fevers or illnesses.  She has a good appetite and denies weight loss.  She has no chest pain, shortness of breath, cough, or hemoptysis.  She denies any nausea, vomiting, constipation, or diarrhea.  She has no urinary complaints.  Patient feels at her baseline and offers no specific complaints today.  REVIEW OF SYSTEMS:   Review of Systems  Constitutional: Negative.  Negative for fever, malaise/fatigue and weight loss.  Respiratory: Negative.  Negative for cough, hemoptysis and shortness of breath.   Cardiovascular: Negative.  Negative for chest pain and leg swelling.  Gastrointestinal: Negative.  Negative for abdominal pain.  Genitourinary: Negative.  Negative for dysuria and hematuria.  Musculoskeletal: Negative.  Negative for back pain.  Skin: Negative.  Negative for rash.  Neurological: Negative.  Negative for dizziness, focal weakness, weakness and headaches.  Psychiatric/Behavioral: Negative.  The patient is not nervous/anxious.     As per HPI. Otherwise, a complete review of systems is negative.  PAST MEDICAL HISTORY: Past Medical History:  Diagnosis Date   Anxiety    Arthritis    Chronic low back pain    GERD (gastroesophageal reflux disease)    Osteoporosis    Age 80 apparently per PCP records    PAST SURGICAL HISTORY: Past Surgical History:  Procedure Laterality Date    JOINT REPLACEMENT     multiple RIGHT knee surgeries     TOTAL HIP ARTHROPLASTY Right 08/22/2022   Procedure: TOTAL HIP ARTHROPLASTY;  Surgeon: Joen Laura, MD;  Location: [facility] OR;  Service: Orthopedics;  Laterality: Right;    FAMILY HISTORY: Family History  Problem Relation Age of Onset   Hypertension Mother    Osteoarthritis Mother    Anxiety disorder Mother    Kidney failure Father    Kidney disease Father    Alcohol abuse Sister     ADVANCED DIRECTIVES (Y/N):  N  HEALTH MAINTENANCE: Social History   Tobacco Use   Smoking status: Every Day    Current packs/day: 1.00    Average packs/day: 1 pack/day for 18.0 years (18.0 ttl pk-yrs)    Types: Cigarettes   Smokeless tobacco: Never   Tobacco comments:    stopped for 12 years before  Vaping Use   Vaping status: Never Used  Substance Use Topics   Alcohol use: Yes    Alcohol/week: 4.0 standard drinks of alcohol    Types: 4 Glasses of wine per week   Drug use: Not Currently     Colonoscopy:  PAP:  Bone density:  Lipid panel:  Allergies  Allergen Reactions   Ibuprofen Other (See Comments)    Heartburn    Morphine Itching and Other (See Comments)    Not effective. Insomnia, hyperactive     Current Outpatient Medications  Medication Sig Dispense Refill   acetaminophen (TYLENOL) 325 MG tablet Take 1-2 tablets (325-650 mg total) by mouth every 8 (eight) hours as needed for mild pain, fever, headache  or moderate pain. 60 tablet 0   cyclobenzaprine (FLEXERIL) 10 MG tablet Take 20 mg by mouth at bedtime.     diclofenac Sodium (VOLTAREN) 1 % GEL Apply 1 Application topically as needed (pain).     docusate sodium (COLACE) 100 MG capsule Take 1 capsule (100 mg total) by mouth 2 (two) times daily. 10 capsule 0   potassium chloride (KLOR-CON) 10 MEQ tablet Take 10 mEq by mouth daily.     traZODone (DESYREL) 50 MG tablet Take 50-100 mg by mouth at bedtime.     Vitamin D, Ergocalciferol, (DRISDOL) 1.25 MG (50000 UNIT)  CAPS capsule Take 1 capsule (50,000 Units total) by mouth every 7 (seven) days. 5 capsule 0   No current facility-administered medications for this visit.    OBJECTIVE: Vitals:   07/21/23 1131  BP: 135/84  Pulse: 87  Resp: 16  Temp: 97.8 F (36.6 C)  SpO2: 100%     Body mass index is 17.11 kg/m.    ECOG FS:0 - Asymptomatic  General: Well-developed, well-nourished, no acute distress. Eyes: Pink conjunctiva, anicteric sclera. HEENT: Normocephalic, moist mucous membranes. Lungs: No audible wheezing or coughing. Heart: Regular rate and rhythm. Abdomen: Soft, nontender, no obvious distention. Musculoskeletal: No edema, cyanosis, or clubbing. Neuro: Alert, answering all questions appropriately. Cranial nerves grossly intact. Skin: No rashes or petechiae noted. Psych: Normal affect. Lymphatics: No cervical, calvicular, axillary or inguinal LAD.   LAB RESULTS:  Lab Results  Component Value Date   NA 130 (L) 08/25/2022   K 4.2 08/25/2022   CL 99 08/25/2022   CO2 23 08/25/2022   GLUCOSE 89 08/25/2022   BUN 10 08/25/2022   CREATININE 0.67 08/25/2022   CALCIUM 8.0 (L) 08/25/2022   PROT 7.2 08/22/2022   ALBUMIN 4.1 08/22/2022   AST 21 08/22/2022   ALT 18 08/22/2022   ALKPHOS 58 08/22/2022   BILITOT <0.1 (L) 08/22/2022   GFRNONAA >60 08/25/2022   GFRAA >60 11/05/2018    Lab Results  Component Value Date   WBC 6.6 08/25/2022   NEUTROABS 6.2 08/22/2022   HGB 8.7 (L) 08/25/2022   HCT 25.0 (L) 08/25/2022   MCV 103.7 (H) 08/25/2022   PLT 155 08/25/2022     STUDIES: CT CHEST W CONTRAST  Result Date: 07/15/2023 CLINICAL DATA:  Pulmonary nodule EXAM: CT CHEST WITH CONTRAST TECHNIQUE: Multidetector CT imaging of the chest was performed during intravenous contrast administration. RADIATION DOSE REDUCTION: This exam was performed according to the departmental dose-optimization program which includes automated exposure control, adjustment of the mA and/or kV according to  patient size and/or use of iterative reconstruction technique. CONTRAST:  75mL ISOVUE-300 IOPAMIDOL (ISOVUE-300) INJECTION 61% COMPARISON:  X-ray 09/10/2023 FINDINGS: Cardiovascular: Normal heart size. No pericardial effusion. Thoracic aorta is nonaneurysmal. Scattered atherosclerotic vascular calcifications of the aorta and coronary arteries. Central pulmonary vasculature is nondilated. Mediastinum/Nodes: No enlarged mediastinal, hilar, or axillary lymph nodes. Thyroid gland, trachea, and esophagus demonstrate no significant findings. Lungs/Pleura: Moderate centrilobular emphysema. Spiculated 10 mm pulmonary nodule within the anterior aspect of the right upper lobe (series 3, image 43). No additional pulmonary nodules. No pleural effusion or pneumothorax. Upper Abdomen: Mild hepatic steatosis.  No acute abnormality. Musculoskeletal: Subacute-appearing healing nondisplaced fracture of the lateral left seventh rib. Chronic appearing superior endplate compression fracture at the T11 level with associated Schmorl's node. Exaggerated thoracic kyphosis. No suspicious bone lesion. IMPRESSION: 1. Spiculated 10 mm pulmonary nodule within the right upper lobe. Consider one of the following in 3 months for both  low-risk and high-risk individuals: (a) repeat chest CT, (b) follow-up PET-CT, or (c) tissue sampling. This recommendation follows the consensus statement: Guidelines for Management of Incidental Pulmonary Nodules Detected on CT Images: From the Fleischner Society 2017; Radiology 2017; 284:228-243. 2. Emphysema (ICD10-J43.9). 3. Subacute-appearing healing nondisplaced fracture of the lateral left seventh rib. 4. Chronic appearing superior endplate compression fracture at the T11 level with associated Schmorl's node. 5. Hepatic steatosis. 6. Aortic and coronary artery atherosclerosis (ICD10-I70.0). Electronically Signed   By: Duanne Guess D.O.   On: 07/15/2023 09:43    ASSESSMENT: Right upper lobe pulmonary  nodule.  PLAN:    Right upper lobe pulmonary nodule: CT scan results from July 15, 2023 reviewed independently and reported as above with a 10 mm right upper lobe pulmonary nodule highly suspicious for underlying malignancy.  Patient has a PET scan scheduled for later this week for further evaluation and staging purposes.  Will also get PFTs in the near future.  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  Follow-up will be based on PET scan results.  I spent a total of 60 minutes reviewing chart data, face-to-face evaluation with the patient, counseling and coordination of care as detailed above.   Patient expressed understanding and was in agreement with this plan. She also understands that She can call clinic at any time with any questions, concerns, or complaints.    Cancer Staging  No matching staging information was found for the patient.   Jeralyn Ruths, MD   07/21/2023 2:37 PM

## 2023-07-21 NOTE — Progress Notes (Signed)
Met with patient during initial consult with Dr. Orlie Dakin. All questions answered during visit. Reviewed upcoming appts. Contact info given and instructed to call with any questions or needs. Pt verbalized understanding. Nothing further needed at this time.

## 2023-07-23 ENCOUNTER — Ambulatory Visit
Admission: RE | Admit: 2023-07-23 | Discharge: 2023-07-23 | Disposition: A | Discharge status: Home / Self Care | Payer: 59 | Source: Ambulatory Visit | Attending: Oncology | Admitting: Oncology

## 2023-07-23 DIAGNOSIS — R911 Solitary pulmonary nodule: Secondary | ICD-10-CM

## 2023-07-23 LAB — GLUCOSE, CAPILLARY: Glucose-Capillary: 60 mg/dL — ABNORMAL LOW (ref 70–99)

## 2023-07-23 MED ORDER — FLUDEOXYGLUCOSE F - 18 (FDG) INJECTION: 5.5000 | Freq: Once | INTRAVENOUS | Status: DC | PRN

## 2023-07-28 ENCOUNTER — Ambulatory Visit: Payer: 59

## 2023-08-03 ENCOUNTER — Ambulatory Visit: Payer: 59 | Attending: Oncology

## 2023-08-03 DIAGNOSIS — F17210 Nicotine dependence, cigarettes, uncomplicated: Secondary | ICD-10-CM | POA: Insufficient documentation

## 2023-08-03 DIAGNOSIS — R911 Solitary pulmonary nodule: Secondary | ICD-10-CM | POA: Diagnosis not present

## 2023-08-03 MED ORDER — ALBUTEROL SULFATE (2.5 MG/3ML) 0.083% IN NEBU
2.5000 mg | INHALATION_SOLUTION | Freq: Once | RESPIRATORY_TRACT | Status: AC
  Administered 2023-08-03: 2.5 mg via RESPIRATORY_TRACT
  Filled 2023-08-03: qty 3

## 2023-08-04 ENCOUNTER — Ambulatory Visit
Admission: RE | Admit: 2023-08-04 | Discharge: 2023-08-04 | Disposition: A | Discharge status: Home / Self Care | Payer: 59 | Source: Ambulatory Visit | Attending: Oncology | Admitting: Oncology

## 2023-08-04 DIAGNOSIS — R296 Repeated falls: Secondary | ICD-10-CM | POA: Diagnosis not present

## 2023-08-04 DIAGNOSIS — M549 Dorsalgia, unspecified: Secondary | ICD-10-CM | POA: Diagnosis not present

## 2023-08-04 DIAGNOSIS — R911 Solitary pulmonary nodule: Secondary | ICD-10-CM | POA: Insufficient documentation

## 2023-08-04 DIAGNOSIS — J439 Emphysema, unspecified: Secondary | ICD-10-CM | POA: Diagnosis not present

## 2023-08-04 DIAGNOSIS — Z96659 Presence of unspecified artificial knee joint: Secondary | ICD-10-CM | POA: Insufficient documentation

## 2023-08-04 DIAGNOSIS — Z87891 Personal history of nicotine dependence: Secondary | ICD-10-CM | POA: Diagnosis not present

## 2023-08-04 LAB — GLUCOSE, CAPILLARY: Glucose-Capillary: 79 mg/dL (ref 70–99)

## 2023-08-04 MED ORDER — FLUDEOXYGLUCOSE F - 18 (FDG) INJECTION
5.5000 | Freq: Once | INTRAVENOUS | Status: AC | PRN
  Administered 2023-08-04: 5.9 via INTRAVENOUS

## 2023-08-10 ENCOUNTER — Encounter: Payer: Self-pay | Admitting: *Deleted

## 2023-08-10 DIAGNOSIS — R911 Solitary pulmonary nodule: Secondary | ICD-10-CM

## 2023-08-10 NOTE — Progress Notes (Signed)
PET scan reviewed by Dr. Orlie Dakin who recommends follow up CT chest with contrast in 6 months with follow up after scan to discuss results. Orders placed. Scheduling to call pt with appts. Pt made aware of PET results and MD recommendations. Nothing further needed at this time.

## 2023-08-25 ENCOUNTER — Inpatient Hospital Stay: Payer: 59 | Attending: Oncology | Admitting: Hospice and Palliative Medicine

## 2023-08-25 DIAGNOSIS — R911 Solitary pulmonary nodule: Secondary | ICD-10-CM

## 2023-08-25 NOTE — Progress Notes (Signed)
Multidisciplinary Oncology Council Documentation  Madison Malone was presented by our [facility] on 08/25/2023, which included representatives from:  Palliative Care Dietitian  Physical/Occupational Therapist Nurse Navigator Genetics Social work Survivorship RN Financial Navigator Research RN   Madison Malone currently presents with history of lung nodule  We reviewed previous medical and familial history, history of present illness, and recent lab results along with all available histopathologic and imaging studies. The MOC considered available treatment options and made the following recommendations/referrals:  None currently  The MOC is a meeting of clinicians from various specialty areas who evaluate and discuss patients for whom a multidisciplinary approach is being considered. Final determinations in the plan of care are those of the provider(s).   Today's extended care, comprehensive team conference, Madison Malone was not present for the discussion and was not examined.

## 2024-02-10 ENCOUNTER — Ambulatory Visit: Payer: 59

## 2024-02-15 ENCOUNTER — Telehealth: Payer: Self-pay | Admitting: *Deleted

## 2024-02-15 NOTE — Telephone Encounter (Signed)
 Spoke to pt, her CT and MD appt been rescheduled.

## 2024-02-15 NOTE — Telephone Encounter (Signed)
 Pt. States that she sent message to reschedule her scan and because she has not got the scan she needs to push out the appt to see finnegan also

## 2024-02-17 ENCOUNTER — Ambulatory Visit: Payer: 59 | Admitting: Oncology

## 2024-02-23 ENCOUNTER — Ambulatory Visit
Admission: RE | Admit: 2024-02-23 | Discharge: 2024-02-23 | Disposition: A | Discharge status: Home / Self Care | Payer: 59 | Source: Ambulatory Visit | Attending: Oncology | Admitting: Oncology

## 2024-02-23 DIAGNOSIS — R911 Solitary pulmonary nodule: Secondary | ICD-10-CM

## 2024-02-23 MED ORDER — IOHEXOL 300 MG/ML  SOLN
75.0000 mL | Freq: Once | INTRAMUSCULAR | Status: AC | PRN
  Administered 2024-02-23: 75 mL via INTRAVENOUS

## 2024-03-02 ENCOUNTER — Telehealth: Payer: Self-pay | Admitting: *Deleted

## 2024-03-02 NOTE — Telephone Encounter (Signed)
 I called an asked to get the scan done and I called the radiology and they have a request to get it done before 9:30 in am. Pt says that she will keep looking for the results and if it not got results by 8:30 in am , she will not come.

## 2024-03-02 NOTE — Telephone Encounter (Signed)
 Pt made aware that CT scan has been reported and to keep appt in the morning.

## 2024-03-03 ENCOUNTER — Encounter: Payer: Self-pay | Admitting: Oncology

## 2024-03-03 ENCOUNTER — Inpatient Hospital Stay: Payer: 59 | Attending: Oncology | Admitting: Oncology

## 2024-03-03 VITALS — BP 173/97 | HR 90 | Temp 98.5°F | Resp 16 | Ht 66.0 in | Wt 111.0 lb

## 2024-03-03 DIAGNOSIS — I10 Essential (primary) hypertension: Secondary | ICD-10-CM | POA: Insufficient documentation

## 2024-03-03 DIAGNOSIS — G8929 Other chronic pain: Secondary | ICD-10-CM | POA: Diagnosis not present

## 2024-03-03 DIAGNOSIS — Z79899 Other long term (current) drug therapy: Secondary | ICD-10-CM | POA: Insufficient documentation

## 2024-03-03 DIAGNOSIS — R911 Solitary pulmonary nodule: Secondary | ICD-10-CM | POA: Insufficient documentation

## 2024-03-03 DIAGNOSIS — M549 Dorsalgia, unspecified: Secondary | ICD-10-CM | POA: Diagnosis not present

## 2024-03-03 DIAGNOSIS — F17210 Nicotine dependence, cigarettes, uncomplicated: Secondary | ICD-10-CM | POA: Insufficient documentation

## 2024-03-03 NOTE — Progress Notes (Signed)
 [facility]  Telephone:(336) [phone] Fax:(336) [phone]  ID: Madison Malone OB: January 02, 1966  MR#: [id number]  WUX#:[id number]  Patient Care Team: Lauro Regulus, MD as PCP - General (Internal Medicine) Glory Buff, RN as Oncology Nurse Navigator  CHIEF COMPLAINT: Right upper lobe pulmonary nodule  INTERVAL HISTORY: Patient returns to clinic today for further evaluation and discussion of her imaging results.  She continues to feel well and remains asymptomatic. She has no neurologic complaints.  She denies any recent fevers or illnesses.  She has a good appetite and denies weight loss.  She has no chest pain, shortness of breath, cough, or hemoptysis.  She denies any nausea, vomiting, constipation, or diarrhea.  She has no urinary complaints.  Patient offers no specific complaints today.    REVIEW OF SYSTEMS:   Review of Systems  Constitutional: Negative.  Negative for fever, malaise/fatigue and weight loss.  Respiratory: Negative.  Negative for cough, hemoptysis and shortness of breath.   Cardiovascular: Negative.  Negative for chest pain and leg swelling.  Gastrointestinal: Negative.  Negative for abdominal pain.  Genitourinary: Negative.  Negative for dysuria and hematuria.  Musculoskeletal:  Positive for back pain.  Skin: Negative.  Negative for rash.  Neurological: Negative.  Negative for dizziness, focal weakness, weakness and headaches.  Psychiatric/Behavioral: Negative.  The patient is not nervous/anxious.     As per HPI. Otherwise, a complete review of systems is negative.  PAST MEDICAL HISTORY: Past Medical History:  Diagnosis Date   Anxiety    Arthritis    Chronic low back pain    GERD (gastroesophageal reflux disease)    Osteoporosis    Age 58 apparently per PCP records    PAST SURGICAL HISTORY: Past Surgical History:  Procedure Laterality Date   JOINT REPLACEMENT     multiple RIGHT knee surgeries     TOTAL HIP ARTHROPLASTY Right  08/22/2022   Procedure: TOTAL HIP ARTHROPLASTY;  Surgeon: Joen Laura, MD;  Location: [facility] OR;  Service: Orthopedics;  Laterality: Right;    FAMILY HISTORY: Family History  Problem Relation Age of Onset   Hypertension Mother    Osteoarthritis Mother    Anxiety disorder Mother    Kidney failure Father    Kidney disease Father    Alcohol abuse Sister     ADVANCED DIRECTIVES (Y/N):  N  HEALTH MAINTENANCE: Social History   Tobacco Use   Smoking status: Every Day    Current packs/day: 1.00    Average packs/day: 1 pack/day for 18.0 years (18.0 ttl pk-yrs)    Types: Cigarettes   Smokeless tobacco: Never   Tobacco comments:    stopped for 12 years before  Vaping Use   Vaping status: Never Used  Substance Use Topics   Alcohol use: Yes    Alcohol/week: 4.0 standard drinks of alcohol    Types: 4 Glasses of wine per week   Drug use: Not Currently     Colonoscopy:  PAP:  Bone density:  Lipid panel:  Allergies  Allergen Reactions   Ibuprofen Other (See Comments)    Heartburn    Morphine Itching and Other (See Comments)    Not effective. Insomnia, hyperactive     Current Outpatient Medications  Medication Sig Dispense Refill   acetaminophen (TYLENOL) 325 MG tablet Take 1-2 tablets (325-650 mg total) by mouth every 8 (eight) hours as needed for mild pain, fever, headache or moderate pain. 60 tablet 0   cyclobenzaprine (FLEXERIL) 10 MG tablet Take 20 mg by mouth  at bedtime.     diclofenac Sodium (VOLTAREN) 1 % GEL Apply 1 Application topically as needed (pain).     docusate sodium (COLACE) 100 MG capsule Take 1 capsule (100 mg total) by mouth 2 (two) times daily. 10 capsule 0   potassium chloride (KLOR-CON) 10 MEQ tablet Take 10 mEq by mouth daily.     traZODone (DESYREL) 50 MG tablet Take 50-100 mg by mouth at bedtime.     Vitamin D, Ergocalciferol, (DRISDOL) 1.25 MG (50000 UNIT) CAPS capsule Take 1 capsule (50,000 Units total) by mouth every 7 (seven) days. 5 capsule  0   No current facility-administered medications for this visit.    OBJECTIVE: Vitals:   03/03/24 0921  BP: (!) 173/97  Pulse: 90  Resp: 16  Temp: 98.5 F (36.9 C)  SpO2: 100%     Body mass index is 17.92 kg/m.    ECOG FS:0 - Asymptomatic  General: Well-developed, well-nourished, no acute distress. Eyes: Pink conjunctiva, anicteric sclera. HEENT: Normocephalic, moist mucous membranes. Lungs: No audible wheezing or coughing. Heart: Regular rate and rhythm. Abdomen: Soft, nontender, no obvious distention. Musculoskeletal: No edema, cyanosis, or clubbing. Neuro: Alert, answering all questions appropriately. Cranial nerves grossly intact. Skin: No rashes or petechiae noted. Psych: Normal affect.   LAB RESULTS:  Lab Results  Component Value Date   NA 130 (L) 08/25/2022   K 4.2 08/25/2022   CL 99 08/25/2022   CO2 23 08/25/2022   GLUCOSE 89 08/25/2022   BUN 10 08/25/2022   CREATININE 0.67 08/25/2022   CALCIUM 8.0 (L) 08/25/2022   PROT 7.2 08/22/2022   ALBUMIN 4.1 08/22/2022   AST 21 08/22/2022   ALT 18 08/22/2022   ALKPHOS 58 08/22/2022   BILITOT <0.1 (L) 08/22/2022   GFRNONAA >60 08/25/2022   GFRAA >60 11/05/2018    Lab Results  Component Value Date   WBC 6.6 08/25/2022   NEUTROABS 6.2 08/22/2022   HGB 8.7 (L) 08/25/2022   HCT 25.0 (L) 08/25/2022   MCV 103.7 (H) 08/25/2022   PLT 155 08/25/2022     STUDIES: CT Chest W Contrast Result Date: 03/02/2024 CLINICAL DATA:  Lung nodule surveillance EXAM: CT CHEST WITH CONTRAST TECHNIQUE: Multidetector CT imaging of the chest was performed during intravenous contrast administration. RADIATION DOSE REDUCTION: This exam was performed according to the departmental dose-optimization program which includes automated exposure control, adjustment of the mA and/or kV according to patient size and/or use of iterative reconstruction technique. CONTRAST:  75mL OMNIPAQUE IOHEXOL 300 MG/ML  SOLN COMPARISON:  PET-CT 08/04/2023 and  chest CT 07/08/2023 FINDINGS: Cardiovascular: Coronary, aortic arch, and branch vessel atherosclerotic vascular disease. Mediastinum/Nodes: Unremarkable Lungs/Pleura: The previous right upper lobe nodule has completely resolved compatible with benign etiology. Old granulomatous disease.  No significant/worrisome nodule. Upper Abdomen: Unremarkable Musculoskeletal: Thoracic spondylosis with chronic superior endplate compression at T11. IMPRESSION: 1. The previous right upper lobe nodule has completely resolved, compatible with benign etiology. No further imaging workup is indicated. 2. Coronary, aortic arch, and branch vessel atherosclerotic vascular disease. Aortic Atherosclerosis (ICD10-I70.0). 3. Thoracic spondylosis with chronic superior endplate compression at T11. Electronically Signed   By: Gaylyn Rong M.D.   On: 03/02/2024 13:26    ASSESSMENT: Right upper lobe pulmonary nodule.  PLAN:    Right upper lobe pulmonary nodule: Resolved.  PET scan results from August 04, 2023 did not reveal any hypermetabolism.  Follow-up CT scan on February 23, 2024 reviewed independently and report as above with complete resolution of right upper lobe  nodule.  No further interventions are needed.  Patient does not require any additional imaging.  No follow-up has been scheduled.   Back pain: Chronic and unchanged.  Follow-up with primary care as needed. Hypertension: Patient's blood pressure is significantly elevated today which she attributes to her ongoing back pain.  Continue monitoring and treatment per primary care.  Patient expressed understanding and was in agreement with this plan. She also understands that She can call clinic at any time with any questions, concerns, or complaints.   Jeralyn Ruths, MD   03/03/2024 9:36 AM

## 2024-04-07 ENCOUNTER — Emergency Department
Admission: EM | Admit: 2024-04-07 | Discharge: 2024-04-07 | Disposition: A | Discharge status: Home / Self Care | Attending: Emergency Medicine | Admitting: Emergency Medicine

## 2024-04-07 ENCOUNTER — Emergency Department

## 2024-04-07 ENCOUNTER — Other Ambulatory Visit: Payer: Self-pay

## 2024-04-07 DIAGNOSIS — M7989 Other specified soft tissue disorders: Secondary | ICD-10-CM | POA: Diagnosis present

## 2024-04-07 DIAGNOSIS — L03115 Cellulitis of right lower limb: Secondary | ICD-10-CM | POA: Insufficient documentation

## 2024-04-07 DIAGNOSIS — I872 Venous insufficiency (chronic) (peripheral): Secondary | ICD-10-CM | POA: Insufficient documentation

## 2024-04-07 DIAGNOSIS — M79604 Pain in right leg: Secondary | ICD-10-CM

## 2024-04-07 LAB — CBC WITH DIFFERENTIAL/PLATELET
Abs Immature Granulocytes: 0.01 10*3/uL (ref 0.00–0.07)
Basophils Absolute: 0 10*3/uL (ref 0.0–0.1)
Basophils Relative: 1 %
Eosinophils Absolute: 0 10*3/uL (ref 0.0–0.5)
Eosinophils Relative: 1 %
HCT: 38.9 % (ref 36.0–46.0)
Hemoglobin: 13.1 g/dL (ref 12.0–15.0)
Immature Granulocytes: 0 %
Lymphocytes Relative: 26 %
Lymphs Abs: 1 10*3/uL (ref 0.7–4.0)
MCH: 34.9 pg — ABNORMAL HIGH (ref 26.0–34.0)
MCHC: 33.7 g/dL (ref 30.0–36.0)
MCV: 103.7 fL — ABNORMAL HIGH (ref 80.0–100.0)
Monocytes Absolute: 0.4 10*3/uL (ref 0.1–1.0)
Monocytes Relative: 11 %
Neutro Abs: 2.2 10*3/uL (ref 1.7–7.7)
Neutrophils Relative %: 61 %
Platelets: 245 10*3/uL (ref 150–400)
RBC: 3.75 MIL/uL — ABNORMAL LOW (ref 3.87–5.11)
RDW: 12.6 % (ref 11.5–15.5)
WBC: 3.7 10*3/uL — ABNORMAL LOW (ref 4.0–10.5)
nRBC: 0 % (ref 0.0–0.2)

## 2024-04-07 LAB — COMPREHENSIVE METABOLIC PANEL WITH GFR
ALT: 14 U/L (ref 0–44)
AST: 22 U/L (ref 15–41)
Albumin: 4 g/dL (ref 3.5–5.0)
Alkaline Phosphatase: 55 U/L (ref 38–126)
Anion gap: 9 (ref 5–15)
BUN: 12 mg/dL (ref 6–20)
CO2: 23 mmol/L (ref 22–32)
Calcium: 9.1 mg/dL (ref 8.9–10.3)
Chloride: 100 mmol/L (ref 98–111)
Creatinine, Ser: 0.55 mg/dL (ref 0.44–1.00)
GFR, Estimated: >60 mL/min (ref >60)
Glucose, Bld: 97 mg/dL (ref 70–99)
Potassium: 3.3 mmol/L — ABNORMAL LOW (ref 3.5–5.1)
Sodium: 132 mmol/L — ABNORMAL LOW (ref 135–145)
Total Bilirubin: 0.4 mg/dL (ref 0.0–1.2)
Total Protein: 7.2 g/dL (ref 6.5–8.1)

## 2024-04-07 MED ORDER — MEDICAL COMPRESSION STOCKINGS MISC: 0 refills | Status: AC

## 2024-04-07 MED ORDER — CEPHALEXIN 500 MG PO CAPS: 500.0000 mg | ORAL_CAPSULE | Freq: Two times a day (BID) | ORAL | 0 refills | Status: DC

## 2024-04-07 MED ORDER — OXYCODONE-ACETAMINOPHEN 5-325 MG PO TABS: 1.0000 | ORAL_TABLET | ORAL | 0 refills | Status: DC | PRN

## 2024-04-07 NOTE — ED Notes (Signed)
 Pt taken to car in wheelchair by this RN and seen get in her car and close the door.

## 2024-04-07 NOTE — ED Triage Notes (Signed)
 Patient comes in from home via POV with complaints of swelling to the right leg, ankle and foot over the past month. Pt states that the swelling usually goes down with elevation, but it's not working anymore. Pt states that past few days it's gotten worse. Pt is alert and oriented x4 with no signs of acute distress at this time.

## 2024-04-07 NOTE — ED Provider Notes (Signed)
 [facility] Provider Note    Event Date/Time   First MD Initiated Contact with Patient 04/07/24 1119     (approximate)  History   Chief Complaint: Leg Pain  HPI  Madison Malone is a 58 y.o. female with a past medical history of anxiety, gastric reflux, chronic lower back pain, presents to the emergency department for right lower extremity swelling and discoloration.  According to the patient she has had multiple surgeries on her right lower extremity the last of which was approximately 1.5 years ago.  She states over the last 3 to 4 weeks she has noticed increased swelling and now discoloration to her right lower extremity.  She states the swelling was mild at first she could elevated in the evening and the swelling would go away.  She states now she elevates it and the swelling still does not completely go away it worsens throughout the day and now she has noted a purpleish discoloration to the foot at times specially as the day progresses.  No history of DVT or PE previously.  No chest pain or shortness of breath.  Physical Exam   Triage Vital Signs: ED Triage Vitals  Encounter Vitals Group     BP 04/07/24 1108 (!) 170/101     Systolic BP Percentile --      Diastolic BP Percentile --      Pulse Rate 04/07/24 1108 74     Resp 04/07/24 1108 17     Temp 04/07/24 1108 97.9 F (36.6 C)     Temp Source 04/07/24 1108 Oral     SpO2 04/07/24 1108 100 %     Weight 04/07/24 1108 110 lb (49.9 kg)     Height 04/07/24 1108 5' 4 (1.626 m)     Head Circumference --      Peak Flow --      Pain Score 04/07/24 1112 10     Pain Loc --      Pain Education --      Exclude from Growth Chart --     Most recent vital signs: Vitals:   04/07/24 1108  BP: (!) 170/101  Pulse: 74  Resp: 17  Temp: 97.9 F (36.6 C)  SpO2: 100%    General: Awake, no distress.  CV:  Good peripheral perfusion. Resp:  Normal effort.  Abd:  No distention.   Other:  Patient has mild  edema of the right lower extremity with mild to moderate pedal edema of the right lower extremity.  Mild discoloration of the distal foot consistent with impaired venous return.  2+ DP pulse.  Normal-appearing left lower extremity.   ED Results / Procedures / Treatments   RADIOLOGY  Ultrasound is negative for DVT   MEDICATIONS ORDERED IN ED: Medications - No data to display   IMPRESSION / MDM / ASSESSMENT AND PLAN / ED COURSE  I reviewed the triage vital signs and the nursing notes.  Patient's presentation is most consistent with acute presentation with potential threat to life or bodily function.  Patient presents emergency department for right lower extremity discomfort swelling and discoloration ongoing over the last 3 to 4 weeks.  Examination is concerning for possible DVT/occlusion.  Reassuringly has a 2+ DP pulse with no signs of ischemia warm foot.  There is signs of possible impaired venous return with the patient's discoloration.  Differential would also include cellulitis although much less likely.  Will obtain an ultrasound as well as basic labs.  Will continue  to closely monitor.  Ultrasound is negative for DVT.  Given mild skin discoloration will cover with Keflex  as a precaution.  Will prescribe a compression stocking as well as a short course of pain medication have the patient follow-up with her doctor.  Patient agreeable to plan of care.  FINAL CLINICAL IMPRESSION(S) / ED DIAGNOSES   Venous insufficiency Cellulitis  Note:  This document was prepared using Dragon voice recognition software and may include unintentional dictation errors.   Dorothyann Drivers, MD 04/07/24 [phone]

## 2024-04-07 NOTE — Discharge Instructions (Addendum)
 As we discussed please take your antibiotic for its entire course.  Please wear your compression stockings during the day you may take these off at night, but please elevate your foot when you are able.  Please take your pain medication as needed but only as prescribed.  Do not drink alcohol or drive while taking this medication.  Please follow-up with your doctor regarding your right leg pain/swelling.  Return to the emergency department for any symptom personally concerning to yourself.

## 2024-06-09 ENCOUNTER — Other Ambulatory Visit: Payer: Self-pay

## 2024-06-09 ENCOUNTER — Emergency Department
Admission: EM | Admit: 2024-06-09 | Discharge: 2024-06-10 | Disposition: A | Discharge status: Other Acute Inpatient Hospital | Attending: Emergency Medicine | Admitting: Emergency Medicine

## 2024-06-09 ENCOUNTER — Emergency Department

## 2024-06-09 DIAGNOSIS — Z96641 Presence of right artificial hip joint: Secondary | ICD-10-CM | POA: Diagnosis not present

## 2024-06-09 DIAGNOSIS — Y9301 Activity, walking, marching and hiking: Secondary | ICD-10-CM | POA: Diagnosis not present

## 2024-06-09 DIAGNOSIS — W1839XA Other fall on same level, initial encounter: Secondary | ICD-10-CM | POA: Diagnosis not present

## 2024-06-09 DIAGNOSIS — S8991XA Unspecified injury of right lower leg, initial encounter: Secondary | ICD-10-CM | POA: Diagnosis present

## 2024-06-09 DIAGNOSIS — S72491A Other fracture of lower end of right femur, initial encounter for closed fracture: Secondary | ICD-10-CM | POA: Diagnosis not present

## 2024-06-09 MED ORDER — KETOROLAC TROMETHAMINE 15 MG/ML IJ SOLN
15.0000 mg | Freq: Once | INTRAMUSCULAR | Status: AC
  Administered 2024-06-10: 15 mg via INTRAVENOUS
  Filled 2024-06-09: qty 1

## 2024-06-09 MED ORDER — FENTANYL CITRATE PF 50 MCG/ML IJ SOSY
100.0000 ug | PREFILLED_SYRINGE | Freq: Once | INTRAMUSCULAR | Status: AC
  Administered 2024-06-10: 100 ug via INTRAVENOUS
  Filled 2024-06-09: qty 2

## 2024-06-09 MED ORDER — ACETAMINOPHEN 500 MG PO TABS
1000.0000 mg | ORAL_TABLET | Freq: Once | ORAL | Status: AC
  Administered 2024-06-10: 1000 mg via ORAL
  Filled 2024-06-09: qty 2

## 2024-06-09 NOTE — ED Triage Notes (Addendum)
 Pt arrives from home via [facility] with moderate swelling and deformity to right knee. Noted to have had 9 previous surgeries on her knee. Pt reports she was walking back into house and her knee gave way and she fell. Pt reports she thinks she landed on that knee. Good circulation and sensation to right knee. States she did have 3 glasses of wine this evening. Received 100 mcg of Fentanyl  en route with EMS.

## 2024-06-09 NOTE — ED Provider Notes (Signed)
 [facility] Provider Note    Event Date/Time   First MD Initiated Contact with Patient 06/09/24 2311     (approximate)   History   Knee Injury   HPI  Madison Malone is a 58 y.o. female   Past medical history of osteoporosis and multiple knee surgeries on the right side all done in [place]  also a right hip replacement done in 2023 by Dr. Edna at [facility], who presents with right knee injury.    Patient reports that right knee buckled and fell onto her right knee today.    No other trauma noted.    Exquisite knee pain and deformity.    External Medical Documents Reviewed: Surgery notes from hip surgery in 2023      Physical Exam   Triage Vital Signs: ED Triage Vitals  Encounter Vitals Group     BP 06/09/24 2304 (!) 142/92     Girls Systolic BP Percentile --      Girls Diastolic BP Percentile --      Boys Systolic BP Percentile --      Boys Diastolic BP Percentile --      Pulse Rate 06/09/24 2304 (!) 105     Resp 06/09/24 2304 (!) 22     Temp 06/09/24 2304 98.1 F (36.7 C)     Temp Source 06/09/24 2304 Oral     SpO2 06/09/24 2304 100 %     Weight 06/09/24 2305 108 lb (49 kg)     Height 06/09/24 2305 5' 4 (1.626 m)     Head Circumference --      Peak Flow --      Pain Score 06/09/24 2305 10     Pain Loc --      Pain Education --      Exclude from Growth Chart --     Most recent vital signs: Vitals:   06/09/24 2304 06/10/24 0136  BP: (!) 142/92 (!) 118/92  Pulse: (!) 105 (!) 110  Resp: (!) 22 18  Temp: 98.1 F (36.7 C) (!) 97.5 F (36.4 C)  SpO2: 100% 100%    General: Awake, no distress.  CV:  Good peripheral perfusion.  Resp:  Normal effort.  Abd:  No distention.  Other:  Deformity to the right knee exquisite tenderness to bony palpation to the area but neurovascular intact distally.  Of note she does have chronic unchanged cool feet to the touch and bluish discoloration that the patient states is chronic and  unchanged.  Sensation remains intact distally.  She is able to range ever so gingerly at the hip and there is no tenderness to that area.  No signs of head trauma.  Awake alert oriented.   ED Results / Procedures / Treatments   Labs (all labs ordered are listed, but only abnormal results are displayed) Labs Reviewed  BASIC METABOLIC PANEL WITH GFR - Abnormal; Notable for the following components:      Result Value   CO2 20 (*)    Calcium 8.2 (*)    All other components within normal limits  CBC WITH DIFFERENTIAL/PLATELET - Abnormal; Notable for the following components:   RBC 3.17 (*)    Hemoglobin 11.0 (*)    HCT 33.1 (*)    MCV 104.4 (*)    MCH 34.7 (*)    All other components within normal limits     I ordered and reviewed the above labs they are notable for cell counts electrolytes unremarkable  RADIOLOGY I independently reviewed and interpreted knee x-ray and see a distal femur fracture I also reviewed radiologist's formal read.   PROCEDURES:  Critical Care performed: No  Procedures   MEDICATIONS ORDERED IN ED: Medications  fentaNYL  (SUBLIMAZE ) injection 100 mcg (100 mcg Intravenous Given 06/10/24 0007)  ketorolac  (TORADOL ) 15 MG/ML injection 15 mg (15 mg Intravenous Given 06/10/24 0008)  acetaminophen  (TYLENOL ) tablet 1,000 mg (1,000 mg Oral Given 06/10/24 0007)  HYDROmorphone  (DILAUDID ) injection 1 mg (1 mg Intravenous Given 06/10/24 0119)  HYDROmorphone  (DILAUDID ) injection 0.5 mg (0.5 mg Intravenous Given 06/10/24 0147)    External physician / consultants:  I spoke with Dr. Kayla Pinal of orthopedics as well as orthopedics consultants from [facility] for transfer regarding care plan for this patient.   IMPRESSION / MDM / ASSESSMENT AND PLAN / ED COURSE  I reviewed the triage vital signs and the nursing notes.                                Patient's presentation is most consistent with acute presentation with potential threat to life or bodily  function.  Differential diagnosis includes, but is not limited to, knee fracture, hip fracture, neurovascular compromise   The patient is on the cardiac monitor to evaluate for evidence of arrhythmia and/or significant heart rate changes.  MDM:    Is a patient with a knee replacement done in [place]  as well as a recent hip replacement done at [facility], with osteoporosis, had her knee buckle and fall and sustained a distal femur fracture which cannot be operated on here at [facility] due to complexity of the injury, needing transfer to tertiary care center and discussed with orthopedist at [facility] accepted ED to ED transfer by Dr. Geroldine at [facility].  They did multiple doses of pain medications in the ED to provide even mild to moderate pain relief.  She has no evidence of neurovascular compromise at this time as her slight discoloration and cool extremities in the feet are long standing and unchanged, and she has strong pulses to the pedal pulse detected on the affected extremity.  Transfer to [facility]       FINAL CLINICAL IMPRESSION(S) / ED DIAGNOSES   Final diagnoses:  Other closed fracture of distal end of right femur, initial encounter (HCC)     Rx / DC Orders   ED Discharge Orders     None        Note:  This document was prepared using Dragon voice recognition software and may include unintentional dictation errors.    Cyrena Mylar, MD 06/10/24 [phone]

## 2024-06-10 ENCOUNTER — Inpatient Hospital Stay (HOSPITAL_COMMUNITY)

## 2024-06-10 ENCOUNTER — Inpatient Hospital Stay (HOSPITAL_COMMUNITY)
Admission: EM | Admit: 2024-06-10 | Discharge: 2024-06-19 | DRG: 467 | Disposition: A | Discharge status: Home Health | Source: Other Acute Inpatient Hospital | Attending: Orthopedic Surgery | Admitting: Orthopedic Surgery

## 2024-06-10 ENCOUNTER — Encounter (HOSPITAL_COMMUNITY): Payer: Self-pay | Admitting: Orthopedic Surgery

## 2024-06-10 ENCOUNTER — Emergency Department

## 2024-06-10 DIAGNOSIS — E871 Hypo-osmolality and hyponatremia: Secondary | ICD-10-CM | POA: Diagnosis present

## 2024-06-10 DIAGNOSIS — M810 Age-related osteoporosis without current pathological fracture: Secondary | ICD-10-CM | POA: Diagnosis present

## 2024-06-10 DIAGNOSIS — E559 Vitamin D deficiency, unspecified: Secondary | ICD-10-CM | POA: Diagnosis present

## 2024-06-10 DIAGNOSIS — Z7982 Long term (current) use of aspirin: Secondary | ICD-10-CM

## 2024-06-10 DIAGNOSIS — M898X9 Other specified disorders of bone, unspecified site: Secondary | ICD-10-CM | POA: Diagnosis present

## 2024-06-10 DIAGNOSIS — Z8249 Family history of ischemic heart disease and other diseases of the circulatory system: Secondary | ICD-10-CM

## 2024-06-10 DIAGNOSIS — Z886 Allergy status to analgesic agent status: Secondary | ICD-10-CM

## 2024-06-10 DIAGNOSIS — E860 Dehydration: Secondary | ICD-10-CM | POA: Diagnosis present

## 2024-06-10 DIAGNOSIS — W19XXXA Unspecified fall, initial encounter: Secondary | ICD-10-CM | POA: Diagnosis present

## 2024-06-10 DIAGNOSIS — S72491A Other fracture of lower end of right femur, initial encounter for closed fracture: Secondary | ICD-10-CM | POA: Diagnosis not present

## 2024-06-10 DIAGNOSIS — S72451A Displaced supracondylar fracture without intracondylar extension of lower end of right femur, initial encounter for closed fracture: Secondary | ICD-10-CM | POA: Diagnosis present

## 2024-06-10 DIAGNOSIS — W1839XA Other fall on same level, initial encounter: Secondary | ICD-10-CM | POA: Diagnosis not present

## 2024-06-10 DIAGNOSIS — Z885 Allergy status to narcotic agent status: Secondary | ICD-10-CM | POA: Diagnosis not present

## 2024-06-10 DIAGNOSIS — F419 Anxiety disorder, unspecified: Secondary | ICD-10-CM | POA: Diagnosis present

## 2024-06-10 DIAGNOSIS — Z841 Family history of disorders of kidney and ureter: Secondary | ICD-10-CM

## 2024-06-10 DIAGNOSIS — Y9301 Activity, walking, marching and hiking: Secondary | ICD-10-CM | POA: Diagnosis not present

## 2024-06-10 DIAGNOSIS — E861 Hypovolemia: Secondary | ICD-10-CM | POA: Diagnosis present

## 2024-06-10 DIAGNOSIS — D539 Nutritional anemia, unspecified: Secondary | ICD-10-CM | POA: Diagnosis present

## 2024-06-10 DIAGNOSIS — Z811 Family history of alcohol abuse and dependence: Secondary | ICD-10-CM

## 2024-06-10 DIAGNOSIS — M545 Low back pain, unspecified: Secondary | ICD-10-CM | POA: Diagnosis present

## 2024-06-10 DIAGNOSIS — R062 Wheezing: Secondary | ICD-10-CM | POA: Diagnosis not present

## 2024-06-10 DIAGNOSIS — E876 Hypokalemia: Secondary | ICD-10-CM

## 2024-06-10 DIAGNOSIS — Z96641 Presence of right artificial hip joint: Secondary | ICD-10-CM | POA: Diagnosis present

## 2024-06-10 DIAGNOSIS — S72401A Unspecified fracture of lower end of right femur, initial encounter for closed fracture: Principal | ICD-10-CM | POA: Diagnosis present

## 2024-06-10 DIAGNOSIS — M7989 Other specified soft tissue disorders: Secondary | ICD-10-CM | POA: Diagnosis not present

## 2024-06-10 DIAGNOSIS — F17213 Nicotine dependence, cigarettes, with withdrawal: Secondary | ICD-10-CM | POA: Diagnosis not present

## 2024-06-10 DIAGNOSIS — D62 Acute posthemorrhagic anemia: Secondary | ICD-10-CM

## 2024-06-10 DIAGNOSIS — S8991XA Unspecified injury of right lower leg, initial encounter: Secondary | ICD-10-CM | POA: Diagnosis present

## 2024-06-10 DIAGNOSIS — J449 Chronic obstructive pulmonary disease, unspecified: Secondary | ICD-10-CM | POA: Diagnosis present

## 2024-06-10 DIAGNOSIS — M25511 Pain in right shoulder: Secondary | ICD-10-CM | POA: Insufficient documentation

## 2024-06-10 DIAGNOSIS — K219 Gastro-esophageal reflux disease without esophagitis: Secondary | ICD-10-CM | POA: Diagnosis present

## 2024-06-10 DIAGNOSIS — Z818 Family history of other mental and behavioral disorders: Secondary | ICD-10-CM

## 2024-06-10 DIAGNOSIS — G8929 Other chronic pain: Secondary | ICD-10-CM | POA: Diagnosis present

## 2024-06-10 DIAGNOSIS — F17210 Nicotine dependence, cigarettes, uncomplicated: Secondary | ICD-10-CM | POA: Diagnosis present

## 2024-06-10 DIAGNOSIS — Z79899 Other long term (current) drug therapy: Secondary | ICD-10-CM

## 2024-06-10 DIAGNOSIS — M79606 Pain in leg, unspecified: Secondary | ICD-10-CM

## 2024-06-10 DIAGNOSIS — R000 Tachycardia, unspecified: Secondary | ICD-10-CM | POA: Diagnosis not present

## 2024-06-10 DIAGNOSIS — M9711XA Periprosthetic fracture around internal prosthetic right knee joint, initial encounter: Secondary | ICD-10-CM | POA: Diagnosis present

## 2024-06-10 DIAGNOSIS — F17200 Nicotine dependence, unspecified, uncomplicated: Secondary | ICD-10-CM | POA: Insufficient documentation

## 2024-06-10 DIAGNOSIS — Z8261 Family history of arthritis: Secondary | ICD-10-CM

## 2024-06-10 LAB — HIV ANTIBODY (ROUTINE TESTING W REFLEX): HIV Screen 4th Generation wRfx: NONREACTIVE

## 2024-06-10 LAB — CBC WITH DIFFERENTIAL/PLATELET
Abs Immature Granulocytes: 0.02 10*3/uL (ref 0.00–0.07)
Basophils Absolute: 0 10*3/uL (ref 0.0–0.1)
Basophils Relative: 0 %
Eosinophils Absolute: 0 10*3/uL (ref 0.0–0.5)
Eosinophils Relative: 0 %
HCT: 33.1 % — ABNORMAL LOW (ref 36.0–46.0)
Hemoglobin: 11 g/dL — ABNORMAL LOW (ref 12.0–15.0)
Immature Granulocytes: 0 %
Lymphocytes Relative: 13 %
Lymphs Abs: 1 10*3/uL (ref 0.7–4.0)
MCH: 34.7 pg — ABNORMAL HIGH (ref 26.0–34.0)
MCHC: 33.2 g/dL (ref 30.0–36.0)
MCV: 104.4 fL — ABNORMAL HIGH (ref 80.0–100.0)
Monocytes Absolute: 0.6 10*3/uL (ref 0.1–1.0)
Monocytes Relative: 7 %
Neutro Abs: 6 10*3/uL (ref 1.7–7.7)
Neutrophils Relative %: 80 %
Platelets: 244 10*3/uL (ref 150–400)
RBC: 3.17 MIL/uL — ABNORMAL LOW (ref 3.87–5.11)
RDW: 12.6 % (ref 11.5–15.5)
WBC: 7.6 10*3/uL (ref 4.0–10.5)
nRBC: 0 % (ref 0.0–0.2)

## 2024-06-10 LAB — BASIC METABOLIC PANEL WITH GFR
Anion gap: 10 (ref 5–15)
BUN: 12 mg/dL (ref 6–20)
CO2: 20 mmol/L — ABNORMAL LOW (ref 22–32)
Calcium: 8.2 mg/dL — ABNORMAL LOW (ref 8.9–10.3)
Chloride: 105 mmol/L (ref 98–111)
Creatinine, Ser: 0.53 mg/dL (ref 0.44–1.00)
GFR, Estimated: >60 mL/min (ref >60)
Glucose, Bld: 97 mg/dL (ref 70–99)
Potassium: 3.7 mmol/L (ref 3.5–5.1)
Sodium: 135 mmol/L (ref 135–145)

## 2024-06-10 LAB — CREATININE, SERUM
Creatinine, Ser: 0.75 mg/dL (ref 0.44–1.00)
GFR, Estimated: >60 mL/min (ref >60)

## 2024-06-10 LAB — CBC
HCT: 27.1 % — ABNORMAL LOW (ref 36.0–46.0)
Hemoglobin: 9.1 g/dL — ABNORMAL LOW (ref 12.0–15.0)
MCH: 34.9 pg — ABNORMAL HIGH (ref 26.0–34.0)
MCHC: 33.6 g/dL (ref 30.0–36.0)
MCV: 103.8 fL — ABNORMAL HIGH (ref 80.0–100.0)
Platelets: 231 10*3/uL (ref 150–400)
RBC: 2.61 MIL/uL — ABNORMAL LOW (ref 3.87–5.11)
RDW: 12.5 % (ref 11.5–15.5)
WBC: 6.9 10*3/uL (ref 4.0–10.5)
nRBC: 0 % (ref 0.0–0.2)

## 2024-06-10 LAB — C-REACTIVE PROTEIN: CRP: 1.3 mg/dL — ABNORMAL HIGH (ref <1.0)

## 2024-06-10 LAB — SEDIMENTATION RATE: Sed Rate: 35 mm/h — ABNORMAL HIGH (ref 0–22)

## 2024-06-10 MED ORDER — HYDROMORPHONE HCL 1 MG/ML IJ SOLN
0.5000 mg | INTRAMUSCULAR | Status: DC | PRN
  Administered 2024-06-10 – 2024-06-18 (×47): 0.5 mg via INTRAVENOUS
  Filled 2024-06-10 (×48): qty 0.5

## 2024-06-10 MED ORDER — METOCLOPRAMIDE HCL 10 MG PO TABS: 5.0000 mg | ORAL_TABLET | Freq: Three times a day (TID) | ORAL | Status: DC | PRN

## 2024-06-10 MED ORDER — OXYCODONE-ACETAMINOPHEN 7.5-325 MG PO TABS
1.0000 | ORAL_TABLET | ORAL | Status: DC | PRN
  Administered 2024-06-10: 1 via ORAL
  Filled 2024-06-10: qty 1

## 2024-06-10 MED ORDER — HYDROCODONE-ACETAMINOPHEN 7.5-325 MG PO TABS
1.0000 | ORAL_TABLET | ORAL | Status: DC | PRN
  Administered 2024-06-10 – 2024-06-11 (×2): 1 via ORAL
  Administered 2024-06-11 (×3): 2 via ORAL
  Administered 2024-06-11: 1 via ORAL
  Administered 2024-06-12: 2 via ORAL
  Filled 2024-06-10 (×7): qty 2

## 2024-06-10 MED ORDER — ENOXAPARIN SODIUM 40 MG/0.4ML IJ SOSY
40.0000 mg | PREFILLED_SYRINGE | INTRAMUSCULAR | Status: DC
  Administered 2024-06-10 – 2024-06-12 (×3): 40 mg via SUBCUTANEOUS
  Filled 2024-06-10 (×3): qty 0.4

## 2024-06-10 MED ORDER — HYDROCODONE-ACETAMINOPHEN 5-325 MG PO TABS
1.0000 | ORAL_TABLET | ORAL | Status: DC | PRN
  Administered 2024-06-10: 2 via ORAL
  Filled 2024-06-10: qty 2

## 2024-06-10 MED ORDER — HYDROMORPHONE HCL 1 MG/ML IJ SOLN
1.0000 mg | Freq: Once | INTRAMUSCULAR | Status: AC
  Administered 2024-06-10: 1 mg via INTRAVENOUS
  Filled 2024-06-10: qty 1

## 2024-06-10 MED ORDER — MAGNESIUM HYDROXIDE 400 MG/5ML PO SUSP
30.0000 mL | Freq: Every day | ORAL | Status: DC | PRN
  Administered 2024-06-16: 30 mL via ORAL
  Filled 2024-06-10: qty 30

## 2024-06-10 MED ORDER — BISACODYL 10 MG RE SUPP: 10.0000 mg | Freq: Every day | RECTAL | Status: DC | PRN

## 2024-06-10 MED ORDER — CHLORHEXIDINE GLUCONATE CLOTH 2 % EX PADS
6.0000 | MEDICATED_PAD | Freq: Every day | CUTANEOUS | Status: DC
  Administered 2024-06-13 – 2024-06-19 (×7): 6 via TOPICAL

## 2024-06-10 MED ORDER — ACETAMINOPHEN 500 MG PO TABS
500.0000 mg | ORAL_TABLET | Freq: Three times a day (TID) | ORAL | Status: DC
  Administered 2024-06-10 – 2024-06-17 (×4): 500 mg via ORAL
  Filled 2024-06-10 (×8): qty 1

## 2024-06-10 MED ORDER — LORAZEPAM 2 MG/ML IJ SOLN
1.0000 mg | Freq: Once | INTRAMUSCULAR | Status: AC
  Administered 2024-06-10: 1 mg via INTRAVENOUS
  Filled 2024-06-10: qty 1

## 2024-06-10 MED ORDER — METHOCARBAMOL 500 MG PO TABS
500.0000 mg | ORAL_TABLET | Freq: Four times a day (QID) | ORAL | Status: DC | PRN
  Administered 2024-06-10 – 2024-06-11 (×4): 500 mg via ORAL
  Filled 2024-06-10 (×5): qty 1

## 2024-06-10 MED ORDER — DOCUSATE SODIUM 100 MG PO CAPS
100.0000 mg | ORAL_CAPSULE | Freq: Two times a day (BID) | ORAL | Status: DC
  Administered 2024-06-10 – 2024-06-19 (×18): 100 mg via ORAL
  Filled 2024-06-10 (×18): qty 1

## 2024-06-10 MED ORDER — HYDROMORPHONE HCL 1 MG/ML IJ SOLN
0.5000 mg | Freq: Once | INTRAMUSCULAR | Status: AC
  Administered 2024-06-10: 0.5 mg via INTRAVENOUS
  Filled 2024-06-10: qty 0.5

## 2024-06-10 MED ORDER — ACETAMINOPHEN 325 MG PO TABS
325.0000 mg | ORAL_TABLET | Freq: Four times a day (QID) | ORAL | Status: DC | PRN
  Administered 2024-06-19: 650 mg via ORAL
  Filled 2024-06-10: qty 2

## 2024-06-10 MED ORDER — METHOCARBAMOL 1000 MG/10ML IJ SOLN: 500.0000 mg | Freq: Four times a day (QID) | INTRAMUSCULAR | Status: DC | PRN

## 2024-06-10 MED ORDER — METOCLOPRAMIDE HCL 5 MG/ML IJ SOLN: 5.0000 mg | Freq: Three times a day (TID) | INTRAMUSCULAR | Status: DC | PRN

## 2024-06-10 MED ORDER — HYDROMORPHONE HCL 1 MG/ML IJ SOLN
1.0000 mg | INTRAMUSCULAR | Status: DC | PRN
  Administered 2024-06-10 (×3): 1 mg via INTRAVENOUS
  Filled 2024-06-10 (×3): qty 1

## 2024-06-10 NOTE — ED Notes (Signed)
 Pt transfer to [facility] ED from [facility] ED for right femur fracture.  Pt with swelling and deformity to right knee.  +1 pedal pulses.

## 2024-06-10 NOTE — ED Provider Notes (Addendum)
 [place] EMERGENCY DEPARTMENT AT [facility] Provider Note   CSN: [id number] Arrival date & time: 06/10/24  9752     Patient presents with: No chief complaint on file.   Madison Malone is a 58 y.o. female.   Patient is a 58 year old female transferred from [facility] emergency department.  Patient's knee gave out on her earlier this evening causing her to fall and injure her right femur.  She was found to have a femur fracture.  She has had both hip and knee replacements and the fracture is just above the hardware in her knee.  She was sent here due to complexity of the injury and care by those familiar with her.  Patient denies other complaint.       Prior to Admission medications   Medication Sig Start Date End Date Taking? Authorizing Provider  acetaminophen  (TYLENOL ) 325 MG tablet Take 1-2 tablets (325-650 mg total) by mouth every 8 (eight) hours as needed for mild pain, fever, headache or moderate pain. 08/24/22   Deward Eck, PA-C  cephALEXin  (KEFLEX ) 500 MG capsule Take 1 capsule (500 mg total) by mouth 2 (two) times daily. 04/07/24   Paduchowski, Kevin, MD  cyclobenzaprine (FLEXERIL) 10 MG tablet Take 20 mg by mouth at bedtime.    [provider]  diclofenac Sodium (VOLTAREN) 1 % GEL Apply 1 Application topically as needed (pain).    [provider]  docusate sodium  (COLACE) 100 MG capsule Take 1 capsule (100 mg total) by mouth 2 (two) times daily. 08/24/22   Deward Eck, PA-C  Elastic Bandages & Supports (MEDICAL COMPRESSION STOCKINGS) MISC Please provide compression stockings 04/07/24   Dorothyann Drivers, MD  oxyCODONE -acetaminophen  (PERCOCET) 5-325 MG tablet Take 1 tablet by mouth every 4 (four) hours as needed for severe pain (pain score 7-10). 04/07/24   Dorothyann Drivers, MD  potassium chloride  (KLOR-CON ) 10 MEQ tablet Take 10 mEq by mouth daily. 06/29/23 06/28/24  [provider]  traZODone  (DESYREL ) 50 MG tablet Take 50-100 mg by  mouth at bedtime.    [provider]  Vitamin D , Ergocalciferol , (DRISDOL ) 1.25 MG (50000 UNIT) CAPS capsule Take 1 capsule (50,000 Units total) by mouth every 7 (seven) days. 08/30/22   Deward Eck, PA-C    Allergies: Ibuprofen and Morphine     Review of Systems  All other systems reviewed and are negative.   Updated Vital Signs BP 120/78 (BP Location: Right Arm)   Pulse (!) 104   Temp 98.9 F (37.2 C) (Oral)   Resp 16   SpO2 95%   Physical Exam Vitals and nursing note reviewed.  Constitutional:      General: She is not in acute distress.    Appearance: She is well-developed. She is not diaphoretic.  HENT:     Head: Normocephalic and atraumatic.   Cardiovascular:     Rate and Rhythm: Normal rate and regular rhythm.     Heart sounds: No murmur heard.    No friction rub. No gallop.  Pulmonary:     Effort: Pulmonary effort is normal. No respiratory distress.     Breath sounds: Normal breath sounds. No wheezing.  Abdominal:     General: Bowel sounds are normal. There is no distension.     Palpations: Abdomen is soft.     Tenderness: There is no abdominal tenderness.   Musculoskeletal:        General: Normal range of motion.     Cervical back: Normal range of motion and neck supple.  Comments: There is swelling noted of the distal femur.  She has pain with any range of motion.  DP pulses are palpable and motor and sensation are intact throughout the entire foot.   Skin:    General: Skin is warm and dry.   Neurological:     General: No focal deficit present.     Mental Status: She is alert and oriented to person, place, and time.     (all labs ordered are listed, but only abnormal results are displayed) Labs Reviewed - No data to display  EKG: None  Radiology: DG Hip Unilat W or Wo Pelvis 2-3 Views Right Result Date: 06/10/2024 CLINICAL DATA:  Fall, right distal femoral fracture. EXAM: DG HIP (WITH OR WITHOUT PELVIS) 2-3V RIGHT COMPARISON:  None  Available. FINDINGS: Right total hip arthroplasty has been performed. No acute fracture or dislocation. Sacroiliac and left hip joint spaces are preserved. Soft tissues are unremarkable. IMPRESSION: 1. Right total hip arthroplasty. No acute fracture or dislocation. Electronically Signed   By: Dorethia Molt M.D.   On: 06/10/2024 00:33   DG Knee 1-2 Views Right Result Date: 06/09/2024 CLINICAL DATA:  Fall. EXAM: RIGHT KNEE - 1-2 VIEW COMPARISON:  None Available. FINDINGS: Right knee arthroplasty is present. There is an acute impacted transverse fracture through the distal femoral diaphysis at the level of the prosthesis. There is apex anterior angulation. There is 12 mm of anterior displacement of proximal fracture fragment. Fracture is mildly comminuted. There is diffuse soft tissue swelling surrounding the knee. There is no dislocation. IMPRESSION: Acute impacted transverse fracture through the distal femoral diaphysis at the level of the prosthesis. Electronically Signed   By: Greig Pique M.D.   On: 06/09/2024 23:43     Procedures   Medications Ordered in the ED  HYDROmorphone  (DILAUDID ) injection 1 mg (1 mg Intravenous Given 06/10/24 0323)                                    Medical Decision Making Risk Prescription drug management. Decision regarding hospitalization.   X-rays and laboratory studies from [place] were reviewed.  I have spoken with the orthopedist, Dr. Beuford who has recommended admission to the hospitalist service.  Hospitalist has evaluated and feels as though admission can be handled by orthopedic service.      Final diagnoses:  None    ED Discharge Orders     None          Geroldine Berg, MD 06/10/24 [name]    Geroldine Berg, MD 06/10/24 9498

## 2024-06-10 NOTE — Consult Note (Signed)
 Orthopaedic Trauma Service (OTS) Consultation   Patient ID: Madison Malone MRN: [id number] DOB/AGE: 58-Sep-1967 58 y.o.   Reason for Consult: complex periprosthetic fracture Referring Physician: Oneil Priestly, MD  HPI: Madison Malone is an 58 y.o. 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  She underwent uneventful and successful right THA by Dr. Rolan Higashi 2 years ago but has continued to struggle with the right knee which buckles and gives way.  She has also had 2 months of right foot and ankle swelling and some pain.  DVT workup was negative.  Infection workup was negative.  Given the complexity of this fracture in the ipsilateral total joint replacements of the knee and hip, Dr. Priestly asserted that fracture repair was out of his realm of practice and that these injuries should be managed by orthopedic traumatologist.  Consequently he consulted me to provide further evaluation and management.  Patient continues to smoke half a pack a day of cigarettes and works as a Energy manager.  Past Medical History:  Diagnosis Date   Anxiety    Arthritis    Chronic low back pain    GERD (gastroesophageal reflux disease)    Osteoporosis    Age 18 apparently per PCP records    Past Surgical History:  Procedure Laterality Date   JOINT REPLACEMENT     multiple RIGHT knee surgeries     TOTAL HIP ARTHROPLASTY Right 08/22/2022   Procedure: TOTAL HIP ARTHROPLASTY;  Surgeon: Higashi Toribio LABOR, MD;  Location: [facility] OR;  Service: Orthopedics;  Laterality: Right;    Family History  Problem Relation Age of Onset   Hypertension Mother    Osteoarthritis Mother    Anxiety disorder Mother    Kidney failure Father    Kidney disease Father    Alcohol abuse Sister     Social History:  reports that she has been smoking cigarettes. She has a 18 pack-year smoking history. She has never used smokeless tobacco. She  reports current alcohol use of about 4.0 standard drinks of alcohol per week. She reports that she does not currently use drugs.  Allergies:  Allergies  Allergen Reactions   Ibuprofen Other (See Comments)    Heartburn    Morphine  Itching and Other (See Comments)    Not effective. Insomnia, hyperactive     Medications: Prior to Admission: (Not in a hospital admission)   Results for orders placed or performed during the hospital encounter of 06/09/24 (from the past 48 hours)  Basic metabolic panel     Status: Abnormal   Collection Time: 06/10/24 12:23 AM  Result Value Ref Range   Sodium 135 135 - 145 mmol/L   Potassium 3.7 3.5 - 5.1 mmol/L   Chloride 105 98 - 111 mmol/L   CO2 20 (L) 22 - 32 mmol/L   Glucose, Bld 97 70 - 99 mg/dL    Comment: Glucose reference range applies only to samples taken after fasting for at least 8 hours.   BUN 12 6 - 20 mg/dL   Creatinine, Ser 9.46 0.44 - 1.00 mg/dL   Calcium 8.2 (L) 8.9 - 10.3 mg/dL   GFR, Estimated >39 >39 mL/min    Comment: (NOTE) Calculated using the CKD-EPI Creatinine Equation (2021)    Anion gap 10 5 - 15    Comment: Performed at [facility], [address], [place], [place] [address]  CBC with Differential  Status: Abnormal   Collection Time: 06/10/24 12:23 AM  Result Value Ref Range   WBC 7.6 4.0 - 10.5 K/uL   RBC 3.17 (L) 3.87 - 5.11 MIL/uL   Hemoglobin 11.0 (L) 12.0 - 15.0 g/dL   HCT 66.8 (L) 63.9 - 53.9 %   MCV 104.4 (H) 80.0 - 100.0 fL   MCH 34.7 (H) 26.0 - 34.0 pg   MCHC 33.2 30.0 - 36.0 g/dL   RDW 87.3 88.4 - 84.4 %   Platelets 244 150 - 400 K/uL   nRBC 0.0 0.0 - 0.2 %   Neutrophils Relative % 80 %   Neutro Abs 6.0 1.7 - 7.7 K/uL   Lymphocytes Relative 13 %   Lymphs Abs 1.0 0.7 - 4.0 K/uL   Monocytes Relative 7 %   Monocytes Absolute 0.6 0.1 - 1.0 K/uL   Eosinophils Relative 0 %   Eosinophils Absolute 0.0 0.0 - 0.5 K/uL   Basophils Relative 0 %   Basophils Absolute 0.0 0.0 - 0.1 K/uL   Immature  Granulocytes 0 %   Abs Immature Granulocytes 0.02 0.00 - 0.07 K/uL    Comment: Performed at [facility], [address] [place], [place] [address]    CT KNEE RIGHT WO CONTRAST Result Date: 06/10/2024 CLINICAL DATA:  Periprosthetic distal femoral fracture EXAM: CT OF THE RIGHT KNEE WITHOUT CONTRAST TECHNIQUE: Multidetector CT imaging of the right knee was performed according to the standard protocol. Multiplanar CT image reconstructions were also generated. RADIATION DOSE REDUCTION: This exam was performed according to the departmental dose-optimization program which includes automated exposure control, adjustment of the mA and/or kV according to patient size and/or use of iterative reconstruction technique. COMPARISON:  Right knee radiograph dated 06/09/2024 FINDINGS: Bones/Joint/Cartilage Prior right knee arthroplasty. The tibial component appears intact and well seated. The femoral component also appears intact, however a comminuted distal femoral metadiaphyseal fracture extends into the interface with the femoral component. There is impaction and 1/2 shaft width posterior and lateral displacement of the dominant fracture fragment. Ligaments Suboptimally assessed by CT. Muscles and Tendons Grossly intact, although the distal femoral thigh musculature is expanded and heterogeneous, related to intramuscular hematoma. Soft tissues Diffuse soft tissue edema about the knee. Suspected joint effusion, although suboptimally evaluated due to beam hardening artifact from the knee arthroplasty. IMPRESSION: 1. Comminuted, impacted, and displaced distal femoral metadiaphyseal fracture extends into the interface with the femoral component of the right knee arthroplasty. 2. Intramuscular hematoma of the distal femoral thigh. Electronically Signed   By: Limin  Xu M.D.   On: 06/10/2024 10:05   DG Hip Unilat W or Wo Pelvis 2-3 Views Right Result Date: 06/10/2024 CLINICAL DATA:  Fall, right distal femoral  fracture. EXAM: DG HIP (WITH OR WITHOUT PELVIS) 2-3V RIGHT COMPARISON:  None Available. FINDINGS: Right total hip arthroplasty has been performed. No acute fracture or dislocation. Sacroiliac and left hip joint spaces are preserved. Soft tissues are unremarkable. IMPRESSION: 1. Right total hip arthroplasty. No acute fracture or dislocation. Electronically Signed   By: Dorethia Molt M.D.   On: 06/10/2024 00:33   DG Knee 1-2 Views Right Result Date: 06/09/2024 CLINICAL DATA:  Fall. EXAM: RIGHT KNEE - 1-2 VIEW COMPARISON:  None Available. FINDINGS: Right knee arthroplasty is present. There is an acute impacted transverse fracture through the distal femoral diaphysis at the level of the prosthesis. There is apex anterior angulation. There is 12 mm of anterior displacement of proximal fracture fragment. Fracture is mildly comminuted. There is diffuse soft  tissue swelling surrounding the knee. There is no dislocation. IMPRESSION: Acute impacted transverse fracture through the distal femoral diaphysis at the level of the prosthesis. Electronically Signed   By: Greig Pique M.D.   On: 06/09/2024 23:43    Intake/Output    None    ROS No recent fever, bleeding abnormalities, urologic dysfunction, GI problems, or weight gain.  Blood pressure (!) 145/110, pulse 92, temperature 98.9 F (37.2 C), resp. rate 16, SpO2 100%. Physical Exam Very pleasant, appears somewhat older than stated age Thin body habitus RLE-- No hip tenderness Old medial parapatellar scar anteriorly.  Marked valgus and flexion deformity at the knee.   Hemarthrosis.   Marked ecchymosis of the foot and particularly the forefoot and toes which are purple.  DP is barely palpable as is the posterior tib.  Sens DPN, SPN, TN intact  Motor EHL, ext, flex, evers intact grossly  Mild ankle swelling  LLE No traumatic wounds, ecchymosis, or rash  Nontender, excellent alignment  No knee or ankle effusion  Knee stable to varus/ valgus and  anterior/posterior stress  Sens DPN, SPN, TN intact  Motor EHL, ext, flex, evers 5/5  No significant edema  Marked ecchymosis of the foot and forefoot which is largely symmetric with the contralateral side  Gait: could not assess Coordination and balance: could not assess   Assessment/Plan:  Right supracondylar femur fracture History of multiple right knee surgeries and instability Status post right total hip arthroplasty Greater than 91-month history of right ankle and foot pain and swelling Osteoporosis Nicotine with half pack a day  This is a profoundly complex case.  I ordered a CT scan which shows very limited bone fixed to the femoral implant, and the implant apparently has not been working too well to begin with.  Repair with plating would probably be most beneficial if it overlaps with the hip stem.  She is at very high risk for nonunion and poor function.  Revision TKA would carry a significant risk of complications as well.  Then there is the issue of the right leg swelling and pain which would benefit from further investigation with regard to both infection and ischemia.  Patient may eat for today.  Will obtain consultation with my total joint colleagues as well as my trauma colleague as we continue to search for the best option to help in this difficult situation. 45 minutes of direct face-to-face time was held with the patient.  Ozell Bruch, MD [facility], [facility] [phone]  06/10/2024, 12:49 PM  [facility] [address] [place] [address] [phone] [phone] (F)    After 5pm and on the weekends please log on to Amion, go to orthopaedics and the look under the [facility] Call for the provider(s) on call. You can also call our office at [phone] and then follow the prompts to be connected to the call team.

## 2024-06-10 NOTE — Progress Notes (Signed)
 VASCULAR LAB    ABI has been performed.  See CV proc for preliminary results.   Shannin Naab, RVT 06/10/2024, 3:08 PM

## 2024-06-10 NOTE — Progress Notes (Signed)
 Orthopedic Tech Progress Note Patient Details:  Madison Malone 30-Oct-1966 [id number] Applied a medial and lateral strut long leg splint with the assistance from PA.  Ortho Devices Type of Ortho Device: Ace wrap, Cotton web roll, Long leg splint Ortho Device/Splint Location: RLE Ortho Device/Splint Interventions: Ordered, Application, Adjustment   Post Interventions Patient Tolerated: Well Instructions Provided: Adjustment of device, Care of device, Poper ambulation with device  [name] 06/10/2024, 3:32 PM

## 2024-06-11 ENCOUNTER — Encounter (HOSPITAL_COMMUNITY): Payer: Self-pay | Admitting: Orthopedic Surgery

## 2024-06-11 DIAGNOSIS — F17200 Nicotine dependence, unspecified, uncomplicated: Secondary | ICD-10-CM | POA: Insufficient documentation

## 2024-06-11 LAB — COMPREHENSIVE METABOLIC PANEL WITH GFR
ALT: 16 U/L (ref 0–44)
AST: 29 U/L (ref 15–41)
Albumin: 3.2 g/dL — ABNORMAL LOW (ref 3.5–5.0)
Alkaline Phosphatase: 48 U/L (ref 38–126)
Anion gap: 5 (ref 5–15)
BUN: 9 mg/dL (ref 6–20)
CO2: 24 mmol/L (ref 22–32)
Calcium: 8.2 mg/dL — ABNORMAL LOW (ref 8.9–10.3)
Chloride: 96 mmol/L — ABNORMAL LOW (ref 98–111)
Creatinine, Ser: 0.57 mg/dL (ref 0.44–1.00)
GFR, Estimated: >60 mL/min (ref >60)
Glucose, Bld: 93 mg/dL (ref 70–99)
Potassium: 4.1 mmol/L (ref 3.5–5.1)
Sodium: 125 mmol/L — ABNORMAL LOW (ref 135–145)
Total Bilirubin: 0.5 mg/dL (ref 0.0–1.2)
Total Protein: 5.9 g/dL — ABNORMAL LOW (ref 6.5–8.1)

## 2024-06-11 LAB — CBC
HCT: 24.3 % — ABNORMAL LOW (ref 36.0–46.0)
Hemoglobin: 8.5 g/dL — ABNORMAL LOW (ref 12.0–15.0)
MCH: 35.6 pg — ABNORMAL HIGH (ref 26.0–34.0)
MCHC: 35 g/dL (ref 30.0–36.0)
MCV: 101.7 fL — ABNORMAL HIGH (ref 80.0–100.0)
Platelets: 194 10*3/uL (ref 150–400)
RBC: 2.39 MIL/uL — ABNORMAL LOW (ref 3.87–5.11)
RDW: 12.3 % (ref 11.5–15.5)
WBC: 5 10*3/uL (ref 4.0–10.5)
nRBC: 0 % (ref 0.0–0.2)

## 2024-06-11 LAB — VAS US ABI WITH/WO TBI
Left ABI: 1.4
Right ABI: 1.2

## 2024-06-11 LAB — VITAMIN D 25 HYDROXY (VIT D DEFICIENCY, FRACTURES): Vit D, 25-Hydroxy: 17.31 ng/mL — ABNORMAL LOW (ref 30–100)

## 2024-06-11 LAB — HEMOGLOBIN A1C
Hgb A1c MFr Bld: 5.8 % — ABNORMAL HIGH (ref 4.8–5.6)
Mean Plasma Glucose: 119.76 mg/dL

## 2024-06-11 MED ORDER — METHOCARBAMOL 1000 MG/10ML IJ SOLN
500.0000 mg | Freq: Three times a day (TID) | INTRAMUSCULAR | Status: DC
  Filled 2024-06-11: qty 10

## 2024-06-11 MED ORDER — METHOCARBAMOL 500 MG PO TABS
750.0000 mg | ORAL_TABLET | Freq: Three times a day (TID) | ORAL | Status: DC
  Administered 2024-06-11 – 2024-06-19 (×21): 750 mg via ORAL
  Filled 2024-06-11: qty 2
  Filled 2024-06-11 (×3): qty 1
  Filled 2024-06-11: qty 2
  Filled 2024-06-11: qty 1
  Filled 2024-06-11 (×2): qty 2
  Filled 2024-06-11 (×2): qty 1
  Filled 2024-06-11 (×2): qty 2
  Filled 2024-06-11: qty 1
  Filled 2024-06-11 (×8): qty 2

## 2024-06-11 MED ORDER — CYCLOBENZAPRINE HCL 10 MG PO TABS
10.0000 mg | ORAL_TABLET | Freq: Three times a day (TID) | ORAL | Status: DC | PRN
  Administered 2024-06-11 – 2024-06-19 (×12): 10 mg via ORAL
  Filled 2024-06-11 (×12): qty 1

## 2024-06-11 MED ORDER — KETOROLAC TROMETHAMINE 15 MG/ML IJ SOLN
15.0000 mg | Freq: Three times a day (TID) | INTRAMUSCULAR | Status: DC
  Administered 2024-06-11 – 2024-06-14 (×8): 15 mg via INTRAVENOUS
  Filled 2024-06-11 (×8): qty 1

## 2024-06-11 MED ORDER — TRAZODONE HCL 50 MG PO TABS
100.0000 mg | ORAL_TABLET | Freq: Every day | ORAL | Status: DC
  Administered 2024-06-11 – 2024-06-18 (×8): 100 mg via ORAL
  Filled 2024-06-11 (×8): qty 2

## 2024-06-11 MED ORDER — VITAMIN D 25 MCG (1000 UNIT) PO TABS
2000.0000 [IU] | ORAL_TABLET | Freq: Two times a day (BID) | ORAL | Status: DC
  Administered 2024-06-11 – 2024-06-19 (×15): 2000 [IU] via ORAL
  Filled 2024-06-11 (×15): qty 2

## 2024-06-11 MED ORDER — LORAZEPAM 1 MG PO TABS
1.0000 mg | ORAL_TABLET | Freq: Three times a day (TID) | ORAL | Status: DC | PRN
  Administered 2024-06-14 – 2024-06-19 (×8): 1 mg via ORAL
  Filled 2024-06-11 (×8): qty 1

## 2024-06-11 NOTE — Progress Notes (Signed)
 OT Cancellation Note  Patient Details Name: Madison Malone MRN: [id number] DOB: Mar 23, 1966   Cancelled Treatment:    Reason Eval/Treat Not Completed: Patient declined, no reason specified- patient declined OT at this time and states she does not want OT only PT.  She reports needing a plan for surgery and to be in less pain before engaging in therapy. OT will sign off at this time, please re-consult when appropriate.   Etta NOVAK, OT Acute Rehabilitation Services Office [phone] Secure Chat Preferred    Etta GORMAN Hope 06/11/2024, 12:09 PM

## 2024-06-11 NOTE — Progress Notes (Signed)
 PT Cancellation Note  Patient Details Name: Madison Malone MRN: [id number] DOB: May 15, 1966   Cancelled Treatment:    Reason Eval/Treat Not Completed: Other (comment) She reports being overwhelmed and needing a plan for surgery and to be in less pain before engaging in therapy.   Will reattempt, Will follow up later today as time allows;  Otherwise, will follow up for PT tomorrow;   Thank you,  Madison Malone, PT  Acute Rehabilitation Services Office [phone]    Madison VEAR Malone 06/11/2024, 12:19 PM

## 2024-06-11 NOTE — Progress Notes (Signed)
 Orthopaedic Trauma Service Progress Note  Patient ID: Morganna Styles MRN: [id number] DOB/AGE: 03-29-1966 58 y.o.  Subjective:  Doing ok  Pain fair Dilaudid  works good but wears off quickly   Tolerating splint   Plan for distal femoral replacement with Dr. Edna on 06/14/2024  ABI's looked ok, I did communicate with Vascular surgery   ROS As above  Today's  total administered Morphine  Milligram Equivalents: 90 Yesterday's total administered Morphine  Milligram Equivalents: 238.75  Objective:   VITALS:   Vitals:   06/10/24 1100 06/10/24 1537 06/10/24 2043 06/11/24 0437  BP: (!) 145/110 (!) 139/91 139/79 (!) 140/79  Pulse: 92 (!) 108 (!) 109 (!) 110  Resp: 16 17 17 18   Temp:  97.9 F (36.6 C) 97.9 F (36.6 C) 98.8 F (37.1 C)  TempSrc:  Oral Oral   SpO2: 100% 100% 99% 99%    Estimated body mass index is 18.54 kg/m as calculated from the following:   Height as of 06/09/24: 5' 4 (1.626 m).   Weight as of 06/09/24: 49 kg.   Intake/Output      06/21 0701 06/22 0700 06/22 0701 06/23 0700   P.O. 240    Total Intake 240    Net +240         Urine Occurrence 2 x      LABS  Results for orders placed or performed during the hospital encounter of 06/10/24 (from the past 24 hours)  Sedimentation rate     Status: Abnormal   Collection Time: 06/10/24  5:50 PM  Result Value Ref Range   Sed Rate 35 (H) 0 - 22 mm/hr  C-reactive protein     Status: Abnormal   Collection Time: 06/10/24  5:50 PM  Result Value Ref Range   CRP 1.3 (H) <1.0 mg/dL  HIV Antibody (routine testing w rflx)     Status: None   Collection Time: 06/10/24  5:50 PM  Result Value Ref Range   HIV Screen 4th Generation wRfx Non Reactive Non Reactive  CBC     Status: Abnormal   Collection Time: 06/10/24  5:50 PM  Result Value Ref Range   WBC 6.9 4.0 - 10.5 K/uL   RBC 2.61 (L) 3.87 - 5.11 MIL/uL   Hemoglobin 9.1 (L) 12.0  - 15.0 g/dL   HCT 72.8 (L) 63.9 - 53.9 %   MCV 103.8 (H) 80.0 - 100.0 fL   MCH 34.9 (H) 26.0 - 34.0 pg   MCHC 33.6 30.0 - 36.0 g/dL   RDW 87.4 88.4 - 84.4 %   Platelets 231 150 - 400 K/uL   nRBC 0.0 0.0 - 0.2 %  Creatinine, serum     Status: None   Collection Time: 06/10/24  5:50 PM  Result Value Ref Range   Creatinine, Ser 0.75 0.44 - 1.00 mg/dL   GFR, Estimated >39 >39 mL/min  Hemoglobin A1c     Status: Abnormal   Collection Time: 06/11/24  6:32 AM  Result Value Ref Range   Hgb A1c MFr Bld 5.8 (H) 4.8 - 5.6 %   Mean Plasma Glucose 119.76 mg/dL  CBC     Status: Abnormal   Collection Time: 06/11/24  6:32 AM  Result Value Ref Range   WBC 5.0 4.0 - 10.5 K/uL   RBC 2.39 (L) 3.87 - 5.11 MIL/uL  Hemoglobin 8.5 (L) 12.0 - 15.0 g/dL   HCT 75.6 (L) 63.9 - 53.9 %   MCV 101.7 (H) 80.0 - 100.0 fL   MCH 35.6 (H) 26.0 - 34.0 pg   MCHC 35.0 30.0 - 36.0 g/dL   RDW 87.6 88.4 - 84.4 %   Platelets 194 150 - 400 K/uL   nRBC 0.0 0.0 - 0.2 %  Comprehensive metabolic panel     Status: Abnormal   Collection Time: 06/11/24  6:32 AM  Result Value Ref Range   Sodium 125 (L) 135 - 145 mmol/L   Potassium 4.1 3.5 - 5.1 mmol/L   Chloride 96 (L) 98 - 111 mmol/L   CO2 24 22 - 32 mmol/L   Glucose, Bld 93 70 - 99 mg/dL   BUN 9 6 - 20 mg/dL   Creatinine, Ser 9.42 0.44 - 1.00 mg/dL   Calcium 8.2 (L) 8.9 - 10.3 mg/dL   Total Protein 5.9 (L) 6.5 - 8.1 g/dL   Albumin 3.2 (L) 3.5 - 5.0 g/dL   AST 29 15 - 41 U/L   ALT 16 0 - 44 U/L   Alkaline Phosphatase 48 38 - 126 U/L   Total Bilirubin 0.5 0.0 - 1.2 mg/dL   GFR, Estimated >39 >39 mL/min   Anion gap 5 5 - 15  VITAMIN D  25 Hydroxy (Vit-D Deficiency, Fractures)     Status: Abnormal   Collection Time: 06/11/24  6:32 AM  Result Value Ref Range   Vit D, 25-Hydroxy 17.31 (L) 30 - 100 ng/mL     PHYSICAL EXAM:   Gen: sitting up in bed, NAD  Lungs: unlabored Cardiac: reg Ext:       Right Lower Extremity Splint fitting well Dressing is clean, dry and  intact  Extremity is cool with palpable DP pulses   No DCT  Compartments are soft  No pain out of proportion with passive stretching of his toes or ankle  DPN, SPN, TN sensory functions are intact  EHL, FHL, lesser toe motor functions intact  Ankle flexion, extension, inversion eversion intact  Toes are purple but unchanged  Foot and ankle hypersensitive     Assessment/Plan:     Principal Problem:   Closed fracture of right distal femur (HCC) Active Problems:   Osteoporosis   Vitamin D  deficiency   Nicotine dependence   Anti-infectives (From admission, onward)    None     .  58 y/o female s/p fall with comminuted R periprosthetic distal femur fracture, numerous surgeries to R knee   -fall  - R periprosthetic distal femur fracture   Will need DFR   Plan for DFR on 06/14/2024 with Dr. Edna at [facility]  Transfer to [facility] tomorrow   Ice and elevate  NWB R leg for now   - cool extremities with color changes, hypersensitivity   Appears chronic   Pt reports as chronic   ABIs look good   VSS suspects autonomic response from her numerous surgeries given normal looking ABI    - Pain management:  Multimodal   Maximize non-narcotic meds   - ABL anemia/Hemodynamics  Cbc in am    - Medical issues   Resume home meds---> trazadone   - DVT/PE prophylaxis:  Lovenox  for now  - Metabolic Bone Disease:  Fracture is a fragility fracture  Vitamin d  levels are deficient   - Activity:  NWB R leg   - FEN/GI prophylaxis/Foley/Lines:  Reg diet   - Impediments to fracture healing:  Nicotine  dependence  Vitamin d  deficiency   - Dispo:  Continue with inpatient care  OR 06/14/2024 at THERESSA Francis MICAEL Deward, PA-C [phone] (C) 06/11/2024, 5:31 PM  [facility] [address] [place] [address] [phone] [id number] (F)    After 5pm and on the weekends please log on to Amion, go to orthopaedics and the look under the [facility] Call for the provider(s) on call. You can also call our office at [phone] and then follow the prompts to be connected to the call team.  Patient ID: Olivia Potash, female   DOB: 11-20-1966, 58 y.o.   MRN: [id number]

## 2024-06-11 NOTE — Plan of Care (Signed)

## 2024-06-11 NOTE — Plan of Care (Signed)

## 2024-06-11 NOTE — Plan of Care (Signed)
   Problem: Clinical Measurements: Goal: Ability to maintain clinical measurements within normal limits will improve Outcome: Progressing

## 2024-06-12 LAB — VITAMIN D 25 HYDROXY (VIT D DEFICIENCY, FRACTURES): Vit D, 25-Hydroxy: 15.62 ng/mL — ABNORMAL LOW (ref 30–100)

## 2024-06-12 MED ORDER — HYDROCODONE-ACETAMINOPHEN 7.5-325 MG PO TABS
1.0000 | ORAL_TABLET | ORAL | Status: DC | PRN
  Administered 2024-06-12 – 2024-06-14 (×13): 2 via ORAL
  Administered 2024-06-15: 1 via ORAL
  Administered 2024-06-15 – 2024-06-16 (×7): 2 via ORAL
  Administered 2024-06-17: 1 via ORAL
  Administered 2024-06-17 – 2024-06-18 (×9): 2 via ORAL
  Administered 2024-06-18 (×2): 1 via ORAL
  Administered 2024-06-19 (×2): 2 via ORAL
  Filled 2024-06-12 (×3): qty 2
  Filled 2024-06-12: qty 1
  Filled 2024-06-12 (×2): qty 2
  Filled 2024-06-12: qty 1
  Filled 2024-06-12 (×7): qty 2
  Filled 2024-06-12: qty 1
  Filled 2024-06-12 (×3): qty 2
  Filled 2024-06-12: qty 1
  Filled 2024-06-12 (×4): qty 2
  Filled 2024-06-12: qty 1
  Filled 2024-06-12 (×2): qty 2
  Filled 2024-06-12: qty 1
  Filled 2024-06-12 (×11): qty 2

## 2024-06-12 NOTE — Progress Notes (Signed)
 Physical Therapy Discharge Patient Details Name: Madison Malone MRN: [id number] DOB: 07-29-1966 Today's Date: 06/12/2024 Time:  -     Patient discharged from PT services secondary to surgery - will need to re-order PT to resume therapy services and patient has refused 3 (three) consecutive times without medical reason.  Please see latest therapy progress note for current level of functioning and progress toward goals.    Progress and discharge plan discussed with patient and/or caregiver: Patient/Caregiver agrees with plan  GP     [name] 06/12/2024, 2:43 PM  [name],PT Acute Rehab Services [phone]

## 2024-06-12 NOTE — H&P (Addendum)
 See Consult note from 6/21 by Ozell Bruch, MD.

## 2024-06-12 NOTE — Progress Notes (Signed)
 PT Cancellation Note  Patient Details Name: Sarabelle Genson MRN: [id number] DOB: 11-10-1966   Cancelled Treatment:    Reason Eval/Treat Not Completed: Other (comment) (Pt refused to work with PT until after surgery on WEdnesday.  Will sign off.  MD- please reorder PT after surgery.)   [name] 06/12/2024, 2:41 PM Tyreak Reagle M,PT Acute Rehab Services [phone]

## 2024-06-12 NOTE — Progress Notes (Signed)
 Initial Nutrition Assessment  DOCUMENTATION CODES:   Not applicable  INTERVENTION:  Ensure Plus High Protein po BID, each supplement provides 350 kcal and 20 grams of protein.  Continued vitamin D3 2,000 units supplementation BID  NUTRITION DIAGNOSIS:  Increased nutrient needs related to hip fracture, post-op healing as evidenced by estimated needs.  GOAL:  Patient will meet greater than or equal to 90% of their needs  MONITOR:  PO intake, Supplement acceptance  REASON FOR ASSESSMENT:  Consult Assessment of nutrition requirement/status  ASSESSMENT:  Pt with hx of R knee pain, arthritis, osteoporosis, GERD, and nicotine dependence. 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 Admitted with complex periprosthetic fx of R distal femur.  Attempted nutrition assessment with pt who was awake and alert. Pt reported her appetite is good and had [place] for breakfast and was planning to eat Chipotle for lunch. Pt disclosed she did not have any concerns about her nutrition and refused any further nutrition assessment. Pt is preparing for complex ortho surgery at [facility] and will be transferred today 6/23 or tomorrow 6/24. Due to increased needs for surgery and post op, pt could likely benefit from Ensure Plus High Protein BID.   Medications reviewed and include:  Vitamin D3 Colace Toradol   Labs reviewed:  Vitamin D  17.31  NUTRITION - FOCUSED PHYSICAL EXAM: Could not complete, pt refused RD assessment  Diet Order:   Diet Order             Diet regular Room service appropriate? Yes; Fluid consistency: Thin  Diet effective now                  EDUCATION NEEDS:   No education needs have been identified at this time  Skin:  Skin Assessment: Reviewed RN Assessment  Last BM:  6/20  Height:  Ht Readings from Last 1 Encounters:  06/09/24 5' 4 (1.626 m)   Weight:  Wt Readings from Last 1 Encounters:  06/09/24 49 kg   Ideal Body Weight:   54.5 kg  BMI:  There is no height or weight on file to calculate BMI.  Estimated Nutritional Needs:  Kcal:  1400-1600  Protein:  60-75g  Fluid:  1.4-1.6L  Josette Glance, MS, RDN, LDN Clinical Dietitian I Please reach out via secure chat

## 2024-06-12 NOTE — TOC CAGE-AID Note (Signed)
 Transition of Care [facility]) - CAGE-AID Screening   Patient Details  Name: Madison Malone MRN: [id number] Date of Birth: 1966/07/09  Transition of [facility]) CM/SW Contact:    [name], LCSW Phone Number: 06/12/2024, 10:21 AM   Clinical Narrative: Patient reports she drinks wine on Fridays. Denies SA resource needs.    CAGE-AID Screening:    Have You Ever Felt You Ought to Cut Down on Your Drinking or Drug Use?: No Have People Annoyed You By Critizing Your Drinking Or Drug Use?: No Have You Felt Bad Or Guilty About Your Drinking Or Drug Use?: No Have You Ever Had a Drink or Used Drugs First Thing In The Morning to Steady Your Nerves or to Get Rid of a Hangover?: No CAGE-AID Score: 0  Substance Abuse Education Offered: Yes

## 2024-06-12 NOTE — Progress Notes (Signed)
     Subjective: Patient admitted over the weekend with periprosthetic right distal femur fracture.  History of multiple revised right knee arthroplasty done in [place]  with subsequent patellectomy me.  Has had some continued pain and instability with this knee since the revision surgery.  Unfortunately due to the new fracture not amendable to fixation patient would benefit from revision to distal femur replacement.  Patient denies pain other joints or extremities.  She also has a history of a hip replacement for fracture done by myself which is doing well overall.  Objective:   VITALS:   Vitals:   06/10/24 2043 06/11/24 0437 06/11/24 1943 06/12/24 0307  BP: 139/79 (!) 140/79 (!) 140/77 (!) 130/91  Pulse: (!) 109 (!) 110 (!) 109 (!) 113  Resp: 17 18 18 18   Temp: 97.9 F (36.6 C) 98.8 F (37.1 C) 98.7 F (37.1 C) 98.2 F (36.8 C)  TempSrc: Oral  Oral Axillary  SpO2: 99% 99% 99% 97%   Right lower extremity Sensation intact distally Intact pulses distally Dorsiflexion/Plantar flexion intact Compartment soft Splint clean, dry, intact  Lab Results  Component Value Date   WBC 5.0 06/11/2024   HGB 8.5 (L) 06/11/2024   HCT 24.3 (L) 06/11/2024   MCV 101.7 (H) 06/11/2024   PLT 194 06/11/2024   BMET    Component Value Date/Time   NA 125 (L) 06/11/2024 0632   K 4.1 06/11/2024 0632   CL 96 (L) 06/11/2024 0632   CO2 24 06/11/2024 0632   GLUCOSE 93 06/11/2024 0632   BUN 9 06/11/2024 0632   CREATININE 0.57 06/11/2024 0632   CALCIUM 8.2 (L) 06/11/2024 0632   GFRNONAA >60 06/11/2024 9367      Xray: X-rays and CT scan reviewed demonstrate very comminuted distal periprosthetic femur fracture with very little bone still attached to the femoral prosthesis.  Assessment/Plan:     Principal Problem:   Closed fracture of right distal femur (HCC) Active Problems:   Osteoporosis   Vitamin D  deficiency   Nicotine dependence  Right periprosthetic distal femur  fracture   Discussed with patient was morning unfortunately sustained a periprosthetic right distal femur fracture.  Given very little bone still attached to the prosthesis patient would benefit from revision to distal femoral replacement.  Tentatively scheduled for surgery Wednesday afternoon.  The risks benefits and alternatives were discussed with the patient including but not limited to the risks of nonoperative treatment, versus surgical intervention including infection, bleeding, nerve injury, malunion, nonunion, the need for revision surgery, hardware prominence, hardware failure, the need for hardware removal, blood clots, cardiopulmonary complications, morbidity, mortality, among others, and they were willing to proceed.    Continue Lovenox  for DVT prophylaxis Pain management Toradol , Tylenol , Norco, Dilaudid  Nonweightbearing right lower extremity immobilized in a long-leg splint Will work on transfer to [facility] today for planned surgery.  Transfer order placed this morning.    Arlesia Kiel A Quindarius Cabello 06/12/2024, 6:24 AM   [name], MD  Contact information:   [phone] 7am-5pm epic message Dr. Higashi, or call office for patient follow up: [phone] After hours and holidays please check [email or web address] for group call information for [facility]

## 2024-06-13 ENCOUNTER — Encounter (HOSPITAL_COMMUNITY): Payer: Self-pay | Admitting: Orthopedic Surgery

## 2024-06-13 LAB — CBC
HCT: 19.8 % — ABNORMAL LOW (ref 36.0–46.0)
Hemoglobin: 6.9 g/dL — CL (ref 12.0–15.0)
MCH: 35 pg — ABNORMAL HIGH (ref 26.0–34.0)
MCHC: 34.8 g/dL (ref 30.0–36.0)
MCV: 100.5 fL — ABNORMAL HIGH (ref 80.0–100.0)
Platelets: 188 10*3/uL (ref 150–400)
RBC: 1.97 MIL/uL — ABNORMAL LOW (ref 3.87–5.11)
RDW: 12.2 % (ref 11.5–15.5)
WBC: 4.8 10*3/uL (ref 4.0–10.5)
nRBC: 0 % (ref 0.0–0.2)

## 2024-06-13 LAB — BASIC METABOLIC PANEL WITH GFR
Anion gap: 9 (ref 5–15)
BUN: 10 mg/dL (ref 6–20)
CO2: 22 mmol/L (ref 22–32)
Calcium: 8.5 mg/dL — ABNORMAL LOW (ref 8.9–10.3)
Chloride: 94 mmol/L — ABNORMAL LOW (ref 98–111)
Creatinine, Ser: 0.67 mg/dL (ref 0.44–1.00)
GFR, Estimated: >60 mL/min (ref >60)
Glucose, Bld: 90 mg/dL (ref 70–99)
Potassium: 3.8 mmol/L (ref 3.5–5.1)
Sodium: 125 mmol/L — ABNORMAL LOW (ref 135–145)

## 2024-06-13 LAB — SURGICAL PCR SCREEN
MRSA, PCR: NEGATIVE
Staphylococcus aureus: NEGATIVE

## 2024-06-13 LAB — ABO/RH: ABO/RH(D): O POS

## 2024-06-13 LAB — HEMOGLOBIN AND HEMATOCRIT, BLOOD
HCT: 24.4 % — ABNORMAL LOW (ref 36.0–46.0)
Hemoglobin: 8.5 g/dL — ABNORMAL LOW (ref 12.0–15.0)

## 2024-06-13 LAB — PREPARE RBC (CROSSMATCH)

## 2024-06-13 MED ORDER — SODIUM CHLORIDE 0.9% IV SOLUTION: Freq: Once | INTRAVENOUS | Status: AC

## 2024-06-13 MED ORDER — ENSURE PLUS HIGH PROTEIN PO LIQD: 237.0000 mL | Freq: Two times a day (BID) | ORAL | Status: DC

## 2024-06-13 NOTE — Plan of Care (Signed)

## 2024-06-13 NOTE — Anesthesia Preprocedure Evaluation (Signed)
 Anesthesia Evaluation  Patient identified by MRN, date of birth, ID band Patient awake    Reviewed: Allergy & Precautions, NPO status , Patient's Chart, lab work & pertinent test results  Airway Mallampati: II  TM Distance: >3 FB Neck ROM: Full    Dental no notable dental hx. (+) Teeth Intact, Dental Advisory Given   Pulmonary Current Smoker and Patient abstained from smoking.   Pulmonary exam normal breath sounds clear to auscultation       Cardiovascular negative cardio ROS Normal cardiovascular exam Rhythm:Regular Rate:Normal     Neuro/Psych  PSYCHIATRIC DISORDERS Anxiety     negative neurological ROS     GI/Hepatic Neg liver ROS,GERD  ,,  Endo/Other  negative endocrine ROS    Renal/GU negative Renal ROS  negative genitourinary   Musculoskeletal  (+) Arthritis , Osteoarthritis,    Abdominal   Peds  Hematology  (+) Blood dyscrasia, anemia   Anesthesia Other Findings Chronic low back pain  All: ibuprofen. Morphine     Reproductive/Obstetrics                             Anesthesia Physical Anesthesia Plan  ASA: 2  Anesthesia Plan: Spinal   Post-op Pain Management: Tylenol  PO (pre-op)*   Induction:   PONV Risk Score and Plan: 2 and Treatment may vary due to age or medical condition, Midazolam , Propofol  infusion, Dexamethasone and Ondansetron   Airway Management Planned: Natural Airway  Additional Equipment: None  Intra-op Plan:   Post-operative Plan:   Informed Consent: I have reviewed the patients History and Physical, chart, labs and discussed the procedure including the risks, benefits and alternatives for the proposed anesthesia with the patient or authorized representative who has indicated his/her understanding and acceptance.     Dental advisory given  Plan Discussed with: CRNA  Anesthesia Plan Comments:        Anesthesia Quick Evaluation

## 2024-06-13 NOTE — Progress Notes (Signed)
     Subjective: Patient doing well this morning. Remains NWB RLE. Discussed plan for surgery tomorrow for right femur fracture. Will transfer to [facility] today for srugery tomorrow. No new concerns.  Objective:   VITALS:   Vitals:   06/12/24 0746 06/12/24 1604 06/12/24 2025 06/13/24 0553  BP: (!) 109/94 126/76 120/60 (!) 156/84  Pulse: (!) 110  (!) 104 (!) 109  Resp: 18 18 16 16   Temp: 98.6 F (37 C) 98.4 F (36.9 C) 98.2 F (36.8 C) 98.2 F (36.8 C)  TempSrc:   Oral Oral  SpO2: 100%  97% 95%   Right lower extremity Sensation intact distally Intact pulses distally Dorsiflexion/Plantar flexion intact Compartment soft Splint clean, dry, intact  Lab Results  Component Value Date   WBC 5.0 06/11/2024   HGB 8.5 (L) 06/11/2024   HCT 24.3 (L) 06/11/2024   MCV 101.7 (H) 06/11/2024   PLT 194 06/11/2024   BMET    Component Value Date/Time   NA 125 (L) 06/11/2024 0632   K 4.1 06/11/2024 0632   CL 96 (L) 06/11/2024 0632   CO2 24 06/11/2024 0632   GLUCOSE 93 06/11/2024 0632   BUN 9 06/11/2024 0632   CREATININE 0.57 06/11/2024 0632   CALCIUM 8.2 (L) 06/11/2024 0632   GFRNONAA >60 06/11/2024 9367      Xray: X-rays and CT scan reviewed demonstrate very comminuted distal periprosthetic femur fracture with very little bone still attached to the femoral prosthesis.  Assessment/Plan:     Principal Problem:   Closed fracture of right distal femur (HCC) Active Problems:   Osteoporosis   Vitamin D  deficiency   Nicotine dependence  Right periprosthetic distal femur fracture   Discussed with patient was morning unfortunately sustained a periprosthetic right distal femur fracture.  Given very little bone still attached to the prosthesis patient would benefit from revision to distal femoral replacement.  Tentatively scheduled for surgery Wednesday afternoon.  The risks benefits and alternatives were discussed with the patient including but not limited to the risks of  nonoperative treatment, versus surgical intervention including infection, bleeding, nerve injury, malunion, nonunion, the need for revision surgery, hardware prominence, hardware failure, the need for hardware removal, blood clots, cardiopulmonary complications, morbidity, mortality, among others, and they were willing to proceed.    Hold lovenox  for surgery tomorrow. NPO after midnight.  Pain management Toradol , Tylenol , Norco, Dilaudid  Nonweightbearing right lower extremity immobilized in a long-leg splint    Madison Malone 06/13/2024, 6:55 AM   Madison Higashi, MD  Contact information:   Tzzxijbd 7am-5pm epic message Dr. Higashi, or call office for patient follow up: [phone] After hours and holidays please check [email or web address] for group call information for [facility]

## 2024-06-13 NOTE — Progress Notes (Signed)
 PT stated that she is missing 3 pairs of [employer] Secret underwear and [employer]ors Flip Flops. Artist, Charge RN made aware, we have looked in patients room no sign of belongings. [facility] at bedside

## 2024-06-13 NOTE — Progress Notes (Signed)
 R  periprosthetic distal femur fracture    06/13/24 1625  TOC Brief Assessment  Insurance and Status Reviewed  Patient has primary care physician Yes  Home environment has been reviewed From home alone. Supportive family and friends.  Prior level of function: PTA independent with ADL's.  States HAS RW, BSC @ home.  Prior/Current Home Services No current home services  Social Drivers of Health Review SDOH reviewed no interventions necessary  Readmission risk has been reviewed No  Transition of care needs transition of care needs identified, TOC will continue to follow   Per MD : plan for surgery tomorrow for right femur fracture. Will transfer to [facility] today for srugery tomorrow.   TOC team following and will assist with needs... Jon Hoit RN,BSN,CM

## 2024-06-13 NOTE — Progress Notes (Signed)
 [facility] has been called and set up.

## 2024-06-13 NOTE — Progress Notes (Signed)
 Date and time results received: 06/13/24 0713 (use smartphrase .now to insert current time)  Test: HGB Critical Value: 6.9  Name of Provider Notified: Marchwainy,MD  Orders Received? Or Actions Taken?:  awaiting orders

## 2024-06-14 ENCOUNTER — Inpatient Hospital Stay (HOSPITAL_COMMUNITY)

## 2024-06-14 ENCOUNTER — Inpatient Hospital Stay (HOSPITAL_COMMUNITY): Payer: Self-pay | Admitting: Anesthesiology

## 2024-06-14 ENCOUNTER — Encounter (HOSPITAL_COMMUNITY): Payer: Self-pay | Admitting: Orthopedic Surgery

## 2024-06-14 ENCOUNTER — Encounter (HOSPITAL_COMMUNITY)
Admission: EM | Disposition: A | Discharge status: Home Health | Payer: Self-pay | Source: Other Acute Inpatient Hospital | Attending: Orthopedic Surgery

## 2024-06-14 ENCOUNTER — Other Ambulatory Visit: Payer: Self-pay

## 2024-06-14 DIAGNOSIS — S72401A Unspecified fracture of lower end of right femur, initial encounter for closed fracture: Secondary | ICD-10-CM

## 2024-06-14 HISTORY — PX: REIMPLANTATION OF TOTAL KNEE: SHX6052

## 2024-06-14 LAB — BPAM RBC
Blood Product Expiration Date: 202507212359
ISSUE DATE / TIME: 202506241257
Unit Type and Rh: 5100

## 2024-06-14 LAB — BASIC METABOLIC PANEL WITH GFR
Anion gap: 11 (ref 5–15)
BUN: 14 mg/dL (ref 6–20)
CO2: 23 mmol/L (ref 22–32)
Calcium: 8.8 mg/dL — ABNORMAL LOW (ref 8.9–10.3)
Chloride: 95 mmol/L — ABNORMAL LOW (ref 98–111)
Creatinine, Ser: 0.6 mg/dL (ref 0.44–1.00)
GFR, Estimated: >60 mL/min (ref >60)
Glucose, Bld: 97 mg/dL (ref 70–99)
Potassium: 3.7 mmol/L (ref 3.5–5.1)
Sodium: 129 mmol/L — ABNORMAL LOW (ref 135–145)

## 2024-06-14 LAB — TYPE AND SCREEN
ABO/RH(D): O POS
Antibody Screen: NEGATIVE
Unit division: 0

## 2024-06-14 LAB — CBC
HCT: 25.5 % — ABNORMAL LOW (ref 36.0–46.0)
Hemoglobin: 8.4 g/dL — ABNORMAL LOW (ref 12.0–15.0)
MCH: 32.8 pg (ref 26.0–34.0)
MCHC: 32.9 g/dL (ref 30.0–36.0)
MCV: 99.6 fL (ref 80.0–100.0)
Platelets: 196 10*3/uL (ref 150–400)
RBC: 2.56 MIL/uL — ABNORMAL LOW (ref 3.87–5.11)
RDW: 14.6 % (ref 11.5–15.5)
WBC: 4.6 10*3/uL (ref 4.0–10.5)
nRBC: 0 % (ref 0.0–0.2)

## 2024-06-14 SURGERY — REVISION, TOTAL ARTHROPLASTY, KNEE
Anesthesia: Spinal | Site: Knee | Laterality: Right

## 2024-06-14 MED ORDER — CEFADROXIL 500 MG PO CAPS: 500.0000 mg | ORAL_CAPSULE | Freq: Two times a day (BID) | ORAL | 0 refills | Status: AC

## 2024-06-14 MED ORDER — KETOROLAC TROMETHAMINE 30 MG/ML IJ SOLN
INTRAMUSCULAR | Status: AC
  Filled 2024-06-14: qty 1

## 2024-06-14 MED ORDER — POLYETHYLENE GLYCOL 3350 17 G PO PACK: 17.0000 g | PACK | Freq: Every day | ORAL | 0 refills | Status: AC

## 2024-06-14 MED ORDER — CHLORHEXIDINE GLUCONATE 0.12 % MT SOLN
15.0000 mL | Freq: Once | OROMUCOSAL | Status: AC
  Administered 2024-06-14: 15 mL via OROMUCOSAL

## 2024-06-14 MED ORDER — CEFAZOLIN SODIUM-DEXTROSE 2-4 GM/100ML-% IV SOLN
2.0000 g | INTRAVENOUS | Status: AC
  Administered 2024-06-14: 2 g via INTRAVENOUS
  Filled 2024-06-14: qty 100

## 2024-06-14 MED ORDER — SODIUM CHLORIDE 0.9 % IV SOLN: INTRAVENOUS | Status: AC

## 2024-06-14 MED ORDER — BUPIVACAINE HCL (PF) 0.5 % IJ SOLN
INTRAMUSCULAR | Status: DC | PRN
Start: 2024-06-14 — End: 2024-06-14
  Administered 2024-06-14: 12 mg via INTRATHECAL

## 2024-06-14 MED ORDER — TRANEXAMIC ACID-NACL 1000-0.7 MG/100ML-% IV SOLN
1000.0000 mg | INTRAVENOUS | Status: AC
  Administered 2024-06-14: 1000 mg via INTRAVENOUS
  Filled 2024-06-14: qty 100

## 2024-06-14 MED ORDER — PHENYLEPHRINE 80 MCG/ML (10ML) SYRINGE FOR IV PUSH (FOR BLOOD PRESSURE SUPPORT)
PREFILLED_SYRINGE | INTRAVENOUS | Status: AC
  Filled 2024-06-14: qty 10

## 2024-06-14 MED ORDER — CEFAZOLIN SODIUM-DEXTROSE 2-4 GM/100ML-% IV SOLN
2.0000 g | Freq: Three times a day (TID) | INTRAVENOUS | Status: AC
  Administered 2024-06-14 – 2024-06-17 (×9): 2 g via INTRAVENOUS
  Filled 2024-06-14 (×9): qty 100

## 2024-06-14 MED ORDER — CYCLOBENZAPRINE HCL 10 MG PO TABS
20.0000 mg | ORAL_TABLET | Freq: Two times a day (BID) | ORAL | 0 refills | Status: AC | PRN
Start: 2024-06-14 — End: ?

## 2024-06-14 MED ORDER — OMEPRAZOLE 40 MG PO CPDR: 40.0000 mg | DELAYED_RELEASE_CAPSULE | Freq: Every day | ORAL | 0 refills | Status: AC

## 2024-06-14 MED ORDER — ROPIVACAINE HCL 5 MG/ML IJ SOLN
INTRAMUSCULAR | Status: DC | PRN
  Administered 2024-06-14: 25 mL via PERINEURAL

## 2024-06-14 MED ORDER — OXYCODONE HCL 5 MG/5ML PO SOLN: 5.0000 mg | Freq: Once | ORAL | Status: AC | PRN

## 2024-06-14 MED ORDER — ONDANSETRON HCL 4 MG/2ML IJ SOLN
INTRAMUSCULAR | Status: DC | PRN
  Administered 2024-06-14: 4 mg via INTRAVENOUS

## 2024-06-14 MED ORDER — PROPOFOL 10 MG/ML IV BOLUS
INTRAVENOUS | Status: DC | PRN
  Administered 2024-06-14 (×3): 20 mg via INTRAVENOUS
  Administered 2024-06-14: 100 ug/kg/min via INTRAVENOUS
  Administered 2024-06-14 (×2): 30 mg via INTRAVENOUS

## 2024-06-14 MED ORDER — BUPIVACAINE LIPOSOME 1.3 % IJ SUSP
INTRAMUSCULAR | Status: AC
  Filled 2024-06-14: qty 20

## 2024-06-14 MED ORDER — MIDAZOLAM HCL 2 MG/2ML IJ SOLN: 1.0000 mg | INTRAMUSCULAR | Status: DC | PRN

## 2024-06-14 MED ORDER — ONDANSETRON HCL 4 MG/2ML IJ SOLN
INTRAMUSCULAR | Status: AC
  Filled 2024-06-14: qty 2

## 2024-06-14 MED ORDER — DEXMEDETOMIDINE HCL IN NACL 80 MCG/20ML IV SOLN
INTRAVENOUS | Status: DC | PRN
  Administered 2024-06-14: 4 ug via INTRAVENOUS
  Administered 2024-06-14: 8 ug via INTRAVENOUS

## 2024-06-14 MED ORDER — ACETAMINOPHEN 500 MG PO TABS: 1000.0000 mg | ORAL_TABLET | Freq: Three times a day (TID) | ORAL | Status: AC | PRN

## 2024-06-14 MED ORDER — ALBUMIN HUMAN 5 % IV SOLN
INTRAVENOUS | Status: AC
  Filled 2024-06-14: qty 500

## 2024-06-14 MED ORDER — ISOPROPYL ALCOHOL 70 % SOLN
Status: AC
  Filled 2024-06-14: qty 480

## 2024-06-14 MED ORDER — OXYCODONE HCL 5 MG PO TABS
5.0000 mg | ORAL_TABLET | Freq: Once | ORAL | Status: AC | PRN
  Administered 2024-06-14: 5 mg via ORAL

## 2024-06-14 MED ORDER — AMISULPRIDE (ANTIEMETIC) 5 MG/2ML IV SOLN: 10.0000 mg | Freq: Once | INTRAVENOUS | Status: DC | PRN

## 2024-06-14 MED ORDER — PHENYLEPHRINE HCL-NACL 20-0.9 MG/250ML-% IV SOLN
INTRAVENOUS | Status: DC | PRN
Start: 2024-06-14 — End: 2024-06-14
  Administered 2024-06-14: 35 ug/min via INTRAVENOUS

## 2024-06-14 MED ORDER — PROPOFOL 1000 MG/100ML IV EMUL
INTRAVENOUS | Status: AC
  Filled 2024-06-14: qty 100

## 2024-06-14 MED ORDER — SODIUM CHLORIDE 0.9 % IR SOLN
Status: DC | PRN
  Administered 2024-06-14: 1000 mL
  Administered 2024-06-14: 3000 mL

## 2024-06-14 MED ORDER — FENTANYL CITRATE PF 50 MCG/ML IJ SOSY: 50.0000 ug | PREFILLED_SYRINGE | INTRAMUSCULAR | Status: DC | PRN

## 2024-06-14 MED ORDER — CEFADROXIL 500 MG PO CAPS
500.0000 mg | ORAL_CAPSULE | Freq: Two times a day (BID) | ORAL | Status: DC
  Administered 2024-06-17 – 2024-06-19 (×4): 500 mg via ORAL
  Filled 2024-06-14 (×4): qty 1

## 2024-06-14 MED ORDER — CHLORHEXIDINE GLUCONATE 4 % EX SOLN
60.0000 mL | Freq: Once | CUTANEOUS | Status: DC
  Filled 2024-06-14: qty 15

## 2024-06-14 MED ORDER — 0.9 % SODIUM CHLORIDE (POUR BTL) OPTIME
TOPICAL | Status: DC | PRN
  Administered 2024-06-14: 1000 mL

## 2024-06-14 MED ORDER — PHENYLEPHRINE HCL (PRESSORS) 10 MG/ML IV SOLN
INTRAVENOUS | Status: DC | PRN
  Administered 2024-06-14 (×3): 160 ug via INTRAVENOUS

## 2024-06-14 MED ORDER — ONDANSETRON HCL 4 MG PO TABS: 4.0000 mg | ORAL_TABLET | Freq: Three times a day (TID) | ORAL | 0 refills | Status: AC | PRN

## 2024-06-14 MED ORDER — SODIUM CHLORIDE 0.9 % IR SOLN: Status: DC | PRN

## 2024-06-14 MED ORDER — ALBUMIN HUMAN 5 % IV SOLN: INTRAVENOUS | Status: DC | PRN

## 2024-06-14 MED ORDER — ACETAMINOPHEN 500 MG PO TABS
1000.0000 mg | ORAL_TABLET | Freq: Once | ORAL | Status: AC
  Administered 2024-06-14: 500 mg via ORAL
  Filled 2024-06-14: qty 2

## 2024-06-14 MED ORDER — FENTANYL CITRATE PF 50 MCG/ML IJ SOSY: 25.0000 ug | PREFILLED_SYRINGE | INTRAMUSCULAR | Status: DC | PRN

## 2024-06-14 MED ORDER — ASPIRIN 81 MG PO TBEC: 81.0000 mg | DELAYED_RELEASE_TABLET | Freq: Two times a day (BID) | ORAL | Status: AC

## 2024-06-14 MED ORDER — SODIUM CHLORIDE (PF) 0.9 % IJ SOLN
INTRAMUSCULAR | Status: AC
  Filled 2024-06-14: qty 50

## 2024-06-14 MED ORDER — NAPROXEN 500 MG PO TBEC
500.0000 mg | DELAYED_RELEASE_TABLET | Freq: Two times a day (BID) | ORAL | 0 refills | Status: AC
Start: 2024-06-14 — End: 2024-07-14

## 2024-06-14 MED ORDER — BUPIVACAINE-EPINEPHRINE (PF) 0.25% -1:200000 IJ SOLN
INTRAMUSCULAR | Status: AC
  Filled 2024-06-14: qty 30

## 2024-06-14 MED ORDER — SODIUM CHLORIDE 0.9 % IV SOLN
INTRAVENOUS | Status: DC | PRN
  Administered 2024-06-14: 60 mL

## 2024-06-14 MED ORDER — POVIDONE-IODINE 10 % EX SWAB
2.0000 | Freq: Once | CUTANEOUS | Status: AC
  Administered 2024-06-14: 2 via TOPICAL

## 2024-06-14 MED ORDER — VANCOMYCIN HCL 1000 MG IV SOLR
INTRAVENOUS | Status: AC
  Filled 2024-06-14: qty 20

## 2024-06-14 MED ORDER — OXYCODONE HCL 5 MG PO TABS
ORAL_TABLET | ORAL | Status: AC
  Filled 2024-06-14: qty 1

## 2024-06-14 MED ORDER — VANCOMYCIN HCL 1000 MG IV SOLR: INTRAVENOUS | Status: DC | PRN

## 2024-06-14 MED ORDER — OXYCODONE HCL 5 MG PO TABS: 5.0000 mg | ORAL_TABLET | ORAL | 0 refills | Status: AC | PRN

## 2024-06-14 MED ORDER — PRONTOSAN WOUND IRRIGATION OPTIME
TOPICAL | Status: DC | PRN
  Administered 2024-06-14: 1 via TOPICAL

## 2024-06-14 MED ORDER — ASPIRIN 81 MG PO TBEC
81.0000 mg | DELAYED_RELEASE_TABLET | Freq: Two times a day (BID) | ORAL | Status: DC
  Administered 2024-06-15 – 2024-06-19 (×9): 81 mg via ORAL
  Filled 2024-06-14 (×9): qty 1

## 2024-06-14 SURGICAL SUPPLY — 60 items
AXLE ORTHOPEDIC SALVAGE SYSTEM (Knees) IMPLANT
BAG COUNTER SPONGE SURGICOUNT (BAG) IMPLANT
BASEPLATE TIB OSS KNEE LONG 63 (Joint) IMPLANT
BEARING TIB OSS 14 TK (Knees) IMPLANT
BIT DRILL QUICK REL 1/8 2PK SL (BIT) IMPLANT
BLADE OSTEOTOME VER FLAT 11X50 (ORTHOPEDIC DISPOSABLE SUPPLIES) IMPLANT
BLADE SAG 18X100X1.27 (BLADE) ×1 IMPLANT
BLADE SAW SAG 35X64 .89 (BLADE) ×1 IMPLANT
BLADE SAW SGTL 81X20 HD (BLADE) IMPLANT
BNDG COHESIVE 4X5 TAN STRL LF (GAUZE/BANDAGES/DRESSINGS) ×1 IMPLANT
BNDG ELASTIC 6X10 VLCR STRL LF (GAUZE/BANDAGES/DRESSINGS) ×1 IMPLANT
BRUSH FEMORAL CANAL (MISCELLANEOUS) IMPLANT
BUSHING TIBIAL OSS ARCOM (Knees) IMPLANT
CABLE 1.7 (Orthopedic Implant) IMPLANT
CANISTER WOUND CARE 500ML ATS (WOUND CARE) ×1 IMPLANT
CEMENT BONE REFOBACIN R1X40 US (Cement) IMPLANT
CEMENT RESTRICTOR BONE PREP ST (Cement) IMPLANT
CHLORAPREP W/TINT 26 (MISCELLANEOUS) ×2 IMPLANT
CNTNR URN SCR LID CUP LEK RST (MISCELLANEOUS) IMPLANT
COVER SURGICAL LIGHT HANDLE (MISCELLANEOUS) ×1 IMPLANT
CUFF TRNQT CYL 24X4X16.5-23 (TOURNIQUET CUFF) IMPLANT
DRAPE INCISE IOBAN 85X60 (DRAPES) ×1 IMPLANT
DRAPE SHEET LG 3/4 BI-LAMINATE (DRAPES) ×1 IMPLANT
DRAPE U-SHAPE 47X51 STRL (DRAPES) ×1 IMPLANT
DRESSING PEEL AND PLAC PRVNA20 (GAUZE/BANDAGES/DRESSINGS) ×1 IMPLANT
FEMORAL BUSHING OSS ARCOM SET (Knees) IMPLANT
GAUZE SPONGE 4X4 12PLY STRL (GAUZE/BANDAGES/DRESSINGS) ×1 IMPLANT
GLOVE BIO SURGEON STRL SZ 6.5 (GLOVE) ×2 IMPLANT
GLOVE BIOGEL PI IND STRL 6.5 (GLOVE) ×1 IMPLANT
GLOVE BIOGEL PI IND STRL 8 (GLOVE) ×1 IMPLANT
GLOVE SURG ORTHO 8.0 STRL STRW (GLOVE) ×2 IMPLANT
GOWN STRL REUS W/ TWL XL LVL3 (GOWN DISPOSABLE) ×2 IMPLANT
HOOD PEEL AWAY T7 (MISCELLANEOUS) ×3 IMPLANT
KIT DRSG PREVENA PLUS 7DAY 125 (MISCELLANEOUS) ×1 IMPLANT
KIT TURNOVER KIT A (KITS) ×1 IMPLANT
MANIFOLD NEPTUNE II (INSTRUMENTS) ×1 IMPLANT
MARKER SKIN DUAL TIP RULER LAB (MISCELLANEOUS) ×1 IMPLANT
NS IRRIG 1000ML POUR BTL (IV SOLUTION) ×1 IMPLANT
PACK TOTAL KNEE CUSTOM (KITS) ×1 IMPLANT
PENCIL SMOKE EVACUATOR (MISCELLANEOUS) ×1 IMPLANT
PIN LOCKING OSS ARCOM (Knees) IMPLANT
PROTECTOR NERVE ULNAR (MISCELLANEOUS) ×1 IMPLANT
SEGMENT FEM KNEE STD 7 RT (Joint) IMPLANT
SET HNDPC FAN SPRY TIP SCT (DISPOSABLE) ×1 IMPLANT
SOLUTION PRONTOSAN WOUND 350ML (IRRIGATION / IRRIGATOR) IMPLANT
STEM EXT BOWED W/SCREW 13X50 (Stem) IMPLANT
SUT ETHILON 3 0 PS 1 (SUTURE) ×4 IMPLANT
SUT STRATAFIX 14 PDO 48 VLT (SUTURE) ×1 IMPLANT
SUT VIC AB 0 CT1 36 (SUTURE) ×1 IMPLANT
SUT VIC AB 1 CT1 36 (SUTURE) IMPLANT
SUT VIC AB 2-0 CT2 27 (SUTURE) ×2 IMPLANT
SUTURE STRATFX 0 PDS 27 VIOLET (SUTURE) ×1 IMPLANT
SYR 50ML LL SCALE MARK (SYRINGE) ×1 IMPLANT
SYSTEM YOKE ORTHOPEDIC SALVAGE (Knees) IMPLANT
TOWER CARTRIDGE SMART MIX (DISPOSABLE) IMPLANT
TRAY FOLEY MTR SLVR 14FR STAT (SET/KITS/TRAYS/PACK) IMPLANT
TUBE SUCTION HIGH CAP CLEAR NV (SUCTIONS) ×1 IMPLANT
UNDERPAD 30X36 HEAVY ABSORB (UNDERPADS AND DIAPERS) ×1 IMPLANT
VERSADRIVER IMPLANT
WRAP KNEE MAXI GEL POST OP (GAUZE/BANDAGES/DRESSINGS) IMPLANT

## 2024-06-14 NOTE — H&P (View-Only) (Signed)
     Subjective: Patient doing well this morning.  NPO since midnight, only small sips of water and black coffee.  Remains NWB RLE.  Discussed plan for surgery today for right femur fracture.  Slept okay, no overnight events.  No new concerns.  Objective:   VITALS:   Vitals:   06/13/24 1810 06/13/24 1815 06/13/24 2143 06/14/24 0518  BP:  (!) 147/87 (!) 139/91 128/85  Pulse:  98 (!) 108 95  Resp:  18 18 18   Temp:  98.7 F (37.1 C) 98.9 F (37.2 C) 98.2 F (36.8 C)  TempSrc:  Oral Oral Oral  SpO2:  100% 98% 100%  Weight: 50 kg     Height: 5' 4 (1.626 m)      Right lower extremity Sensation intact distally Intact pulses distally Dorsiflexion/Plantar flexion intact Compartment soft Splint clean, dry, intact Ice packs in place Wiggles toes appropriately   Lab Results  Component Value Date   WBC 4.8 06/13/2024   HGB 8.5 (L) 06/13/2024   HCT 24.4 (L) 06/13/2024   MCV 100.5 (H) 06/13/2024   PLT 188 06/13/2024   BMET    Component Value Date/Time   NA 125 (L) 06/13/2024 0612   K 3.8 06/13/2024 0612   CL 94 (L) 06/13/2024 0612   CO2 22 06/13/2024 0612   GLUCOSE 90 06/13/2024 0612   BUN 10 06/13/2024 0612   CREATININE 0.67 06/13/2024 0612   CALCIUM 8.5 (L) 06/13/2024 0612   GFRNONAA >60 06/13/2024 0612      Xray: X-rays and CT scan reviewed demonstrate very comminuted distal periprosthetic femur fracture with very little bone still attached to the femoral prosthesis.  Assessment/Plan:     Principal Problem:   Closed fracture of right distal femur (HCC) Active Problems:   Osteoporosis   Vitamin D  deficiency   Nicotine dependence  Right periprosthetic distal femur fracture   Still plan for distal femur arthroplasty today.  Opportunity given to address questions and concerns.  Patient in agreement with plan.  Last dose lovenox  06/12/24 around 8pm. NPO after midnight.  Pain management Toradol , Tylenol , Norco, Dilaudid  Nonweightbearing right lower  extremity immobilized in a long-leg splint    Madison Malone 06/14/2024, 7:37 AM    Contact information:   Tzzxijbd 7am-5pm epic message Dr. Edna, or call office for patient follow up: [phone] After hours and holidays please check [email or web address] for group call information for [facility]

## 2024-06-14 NOTE — Anesthesia Procedure Notes (Addendum)
 Anesthesia Regional Block: Adductor canal block   Pre-Anesthetic Checklist: , timeout performed,  Correct Patient, Correct Site, Correct Laterality,  Correct Procedure, Correct Position, site marked,  Risks and benefits discussed,  Surgical consent,  Pre-op evaluation,  At surgeon's request and post-op pain management  Laterality: Lower and Right  Prep: chloraprep       Needles:  Injection technique: Single-shot  Needle Type: Echogenic Needle     Needle Length: 9cm  Needle Gauge: 22     Additional Needles:   Procedures:,,,, ultrasound used (permanent image in chart),,    Narrative:  Start time: 06/14/2024 1:53 PM End time: 06/14/2024 1:56 PM Injection made incrementally with aspirations every 5 mL.  Performed by: Personally  Anesthesiologist: Jefm Garnette LABOR, MD  Additional Notes: Block assessed prior to surgery. Pt tolerated procedure well.

## 2024-06-14 NOTE — Transfer of Care (Signed)
 Immediate Anesthesia Transfer of Care Note  Patient: Madison Malone  Procedure(s) Performed: RIGHT KNEE REVISION TO DISTAL FEMUR REPLACEMENT (Right: Knee)  Patient Location: PACU  Anesthesia Type:MAC, Regional, and Spinal  Level of Consciousness: awake, alert , oriented, and patient cooperative  Airway & Oxygen Therapy: Patient Spontanous Breathing and Patient connected to face mask oxygen  Post-op Assessment: Report given to RN and Post -op Vital signs reviewed and stable  Post vital signs: Reviewed and stable  Last Vitals:  Vitals Value Taken Time  BP 105/69 06/14/24 16:48  Temp    Pulse 111 06/14/24 16:51  Resp 16 06/14/24 16:51  SpO2 97 % 06/14/24 16:51  Vitals shown include unfiled device data.  Last Pain:  Vitals:   06/14/24 1240  TempSrc:   PainSc: 10-Worst pain ever      Patients Stated Pain Goal: 5 (06/14/24 1159)  Complications: No notable events documented.

## 2024-06-14 NOTE — Op Note (Signed)
 DATE OF SURGERY:  06/14/2024 TIME: 4:09 PM  PATIENT NAME:  Madison Malone   AGE: 58 y.o.    PRE-OPERATIVE DIAGNOSIS: Right periprosthetic distal femur fracture  POST-OPERATIVE DIAGNOSIS:  Same  PROCEDURE: Right revision total knee arthroplasty to distal femoral placement  SURGEON:  Shay Bartoli A Kemonie Cutillo, MD   ASSISTANT: Bernarda Mclean, PA-C, present and scrubbed throughout the case, critical for assistance with exposure, retraction, instrumentation, and closure.   OPERATIVE IMPLANTS:  Cemented [employer] OSS distal femur replacement 63 mm tibia with 160 x 10 mm nonmodular stem, size 7 right OSS elliptical hinge femur, modular 13 x 150 mm bowed cemented stem Implant Name Type Inv. Item Serial No. Manufacturer Lot No. LRB No. Used Action  CEMENT BONE REFOBACIN [id number] US  - [id number] Cement CEMENT BONE REFOBACIN [id number] US   ZIMMER RECON(ORTH,TRAU,BIO,SG) [id number] Right 3 Implanted  CEMENT RESTRICTOR BONE PREP ST - [id number] Cement CEMENT RESTRICTOR BONE PREP ST  STRYKER INSTRUMENTS [id number] Right 1 Implanted  BASEPLATE TIB OSS KNEE LONG 63 - [id number] Joint BASEPLATE TIB OSS KNEE LONG 63  ZIMMER RECON(ORTH,TRAU,BIO,SG) [id number] Right 1 Implanted  SEGMENT FEM KNEE STD 7 RT - [id number] Joint SEGMENT FEM KNEE STD 7 RT  ZIMMER RECON(ORTH,TRAU,BIO,SG) [id number] Right 1 Implanted  PIN LOCKING OSS ARCOM - [id number] Knees PIN LOCKING OSS ARCOM  ZIMMER RECON(ORTH,TRAU,BIO,SG) [id number] Right 1 Implanted  BUSHING TIBIAL OSS ARCOM - [id number] Knees BUSHING TIBIAL OSS ARCOM  ZIMMER RECON(ORTH,TRAU,BIO,SG) [id number] Right 1 Implanted  SYSTEM YOKE ORTHOPEDIC SALVAGE - [id number] Knees SYSTEM YOKE ORTHOPEDIC SALVAGE  ZIMMER RECON(ORTH,TRAU,BIO,SG) [id number] Right 1 Implanted  FEMORAL BUSHING OSS ARCOM SET - [id number] Knees FEMORAL BUSHING OSS ARCOM SET  ZIMMER RECON(ORTH,TRAU,BIO,SG) [id number] Right 1 Implanted  STEM EXT BOWED W/SCREW 13X50 - [id number] Stem STEM EXT BOWED W/SCREW 13X50  ZIMMER  RECON(ORTH,TRAU,BIO,SG) [id number] Right 1 Implanted  AXLE ORTHOPEDIC SALVAGE SYSTEM - [id number] Knees AXLE ORTHOPEDIC SALVAGE SYSTEM  ZIMMER RECON(ORTH,TRAU,BIO,SG) [id number] Right 1 Implanted  OSS 14mm TIBIAL BEARING    BIOMET ZIMMER MICROFIXATION [id number] Right 1 Implanted  CABLE 1.7 - [id number] Orthopedic Implant CABLE 1.7  DEPUY ORTHOPAEDICS [id number] Right 1 Implanted      PREOPERATIVE INDICATIONS:  Madison Malone is a 58 y.o. year old female who sustained a fall on 06/09/2024 after her right knee buckled on her.  She was seen at [facility] and found to have a periprosthetic distal femur fracture.  She was transferred to [facility] and Dr. Celena was first consulted who felt that the fracture was not amenable to fixation given how distal the fracture was at therapy no distal bone to repair of the femur with.  We felt that revision to distal femoral placement was the best option to address the comminuted distal femur fracture with fracture medial lateral condyles.  The risks, benefits, and alternatives were discussed at length including but not limited to the risks of infection, bleeding, nerve injury, stiffness, blood clots, the need for revision surgery, cardiopulmonary complications, among others, and they were willing to proceed.  ESTIMATED BLOOD LOSS: 100cc  OPERATIVE DESCRIPTION:  Once adequate anesthesia was induced, preoperative antibiotics, 2g of ancef ,1 gm of Tranexamic Acid , and 8 mg of Decadron administered, the patient was positioned supine with a right thigh tourniquet placed.  The right lower extremity was prepped and draped in sterile fashion.  A time-out was performed identifying the patient, planned procedure, and the appropriate extremity.     The leg was  exsanguinated, tourniquet elevated to .  A midline incision was  made utilizing the  patient's old scar followed by median parapatellar arthrotomy.  Circumferential synovectomy was performed.  TSevere comminution of the  distal femur fracture was appreciated.  The femoral implant could be easily removed without difficulty and minimal remaining bone attachment to it.  The condyles were both fractured on the medial lateral side and these were skeletonized and removed.  We marked the rotation as well as 7 cm above the joint line for our resection length.  With the posterior capsule protected the distal femur was cut 7 cm above the joint line with plans for a 7 cm distal femoral segment.     At this point we turned our attention to the tibia.  The tibial baseplate was overall well-fixed.  Once the baseplate was circumferentially exposed we used an ACL saw and osteotome to remove the tibial baseplate.  The cement mantle in the canal was removed using the [facility].  Intramedullary guide was introduced and we resected a total of 4 mm to get down to clean healthy bone.   We sized the tibia to be a 63mm tibia size.  We prepped the canal.  Marked the rotation.  A trial tibia was then inserted.  We then turned attention to prepping of the femoral canal.  We reamed the femoral canal up to 14.5 mm for 13mm diameter stem with 150 mm stem length.  A trial femur was then inserted with a 14 mm position bearing trial which we felt to restore the length given the amount of bone resected and the side of his size of her tibial bearing with poly. Although there was no patella, the capsule was reduced and felt that alignment, length, and rotation were appropriate..   We were pleased with the position of the components.  We opened the real implants and assembled them on the back table.  During this time the trial plants were removed and the canals were irrigated.  We also temporarily dropped the tourniquet to look for any bleeding in the posterior capsule.  Good hemostasis was appreciated.  Cement restrictor's were placed in the femoral and tibial canals.     We mixed 3 batches of cement and the tibial femoral components were  inserted and held in position until the cement was fully set.  The tibial bearing with a 14mm poly was then assembled. The wound was then again irrigated and the synovial lining was  then injected a dilute Exparel  with 30cc of 0.25% marcaine  with epinephrine.       The tourniquet had been let down again.  No significant hemostasis was required.  The medial parapatellar arthrotomy was then reapproximated using #1 Vicryl and #1 Stratafix sutures with the knee  in flexion.   The remaining wound was closed with 0 stratafix, and 3-0 nylon.  Incisional wound VAC was applied. The patient was then brought to recovery room in stable condition, tolerating the procedure  well. There were no complications.   Post op recs: WB: WBAT Abx: ancef  Imaging: PACU xrays DVT prophylaxis: Aspirin  81mg  BID x4 weeks Follow up: 2 weeks after surgery for a wound check with Dr. Edna at [facility].  Address: [address], [place], [place] [address]  Office Phone: [phone]  Toribio Edna, MD Orthopaedic Surgery

## 2024-06-14 NOTE — Plan of Care (Signed)

## 2024-06-14 NOTE — Anesthesia Procedure Notes (Signed)
 Procedure Name: MAC Date/Time: 06/14/2024 1:27 PM  Performed by: Zulema Leita PARAS, CRNAPre-anesthesia Checklist: Patient identified, Emergency Drugs available, Suction available and Patient being monitored Oxygen Delivery Method: Simple face mask

## 2024-06-14 NOTE — OR Nursing (Signed)
 Components explanted from right knee

## 2024-06-14 NOTE — Interval H&P Note (Signed)
 The patient has been re-examined, and the chart reviewed, and there have been no interval changes to the documented history and physical.    Plan for Revision R Total knee to distal femur replacement for periprosthetic distal femur fracture  The operative side was examined and the patient was confirmed to have sensation to DPN, SPN, TN intact, Motor EHL, ext, flex 5/5, and DP 2+, PT 2+, No significant edema.   The risks, benefits, and alternatives have been discussed at length with patient, and the patient is willing to proceed.  Right knee marked. Consent has been signed.

## 2024-06-14 NOTE — Progress Notes (Signed)
     Subjective: Patient doing well this morning.  NPO since midnight, only small sips of water and black coffee.  Remains NWB RLE.  Discussed plan for surgery today for right femur fracture.  Slept okay, no overnight events.  No new concerns.  Objective:   VITALS:   Vitals:   06/13/24 1810 06/13/24 1815 06/13/24 2143 06/14/24 0518  BP:  (!) 147/87 (!) 139/91 128/85  Pulse:  98 (!) 108 95  Resp:  18 18 18   Temp:  98.7 F (37.1 C) 98.9 F (37.2 C) 98.2 F (36.8 C)  TempSrc:  Oral Oral Oral  SpO2:  100% 98% 100%  Weight: 50 kg     Height: 5' 4 (1.626 m)      Right lower extremity Sensation intact distally Intact pulses distally Dorsiflexion/Plantar flexion intact Compartment soft Splint clean, dry, intact Ice packs in place Wiggles toes appropriately   Lab Results  Component Value Date   WBC 4.8 06/13/2024   HGB 8.5 (L) 06/13/2024   HCT 24.4 (L) 06/13/2024   MCV 100.5 (H) 06/13/2024   PLT 188 06/13/2024   BMET    Component Value Date/Time   NA 125 (L) 06/13/2024 0612   K 3.8 06/13/2024 0612   CL 94 (L) 06/13/2024 0612   CO2 22 06/13/2024 0612   GLUCOSE 90 06/13/2024 0612   BUN 10 06/13/2024 0612   CREATININE 0.67 06/13/2024 0612   CALCIUM 8.5 (L) 06/13/2024 0612   GFRNONAA >60 06/13/2024 0612      Xray: X-rays and CT scan reviewed demonstrate very comminuted distal periprosthetic femur fracture with very little bone still attached to the femoral prosthesis.  Assessment/Plan:     Principal Problem:   Closed fracture of right distal femur (HCC) Active Problems:   Osteoporosis   Vitamin D  deficiency   Nicotine dependence  Right periprosthetic distal femur fracture   Still plan for distal femur arthroplasty today.  Opportunity given to address questions and concerns.  Patient in agreement with plan.  Last dose lovenox  06/12/24 around 8pm. NPO after midnight.  Pain management Toradol , Tylenol , Norco, Dilaudid  Nonweightbearing right lower  extremity immobilized in a long-leg splint    Madison Malone 06/14/2024, 7:37 AM    Contact information:   [id number] 7am-5pm epic message Dr. Edna, or call office for patient follow up: [phone] After hours and holidays please check [email or web address] for group call information for [facility]

## 2024-06-14 NOTE — Anesthesia Procedure Notes (Addendum)
 Spinal  Patient location during procedure: OR Start time: 06/14/2024 1:33 AM End time: 06/14/2024 1:48 PM Reason for block: surgical anesthesia Staffing Performed: anesthesiologist  Anesthesiologist: Jefm Garnette LABOR, MD Performed by: Jefm Garnette LABOR, MD Authorized by: Jefm Garnette LABOR, MD   Preanesthetic Checklist Completed: patient identified, IV checked, risks and benefits discussed, surgical consent, monitors and equipment checked, pre-op evaluation and timeout performed Spinal Block Patient position: sitting Prep: DuraPrep and site prepped and draped Patient monitoring: heart rate, cardiac monitor, continuous pulse ox and blood pressure Approach: midline Location: L3-4 Injection technique: single-shot Needle Needle type: Pencan  Needle gauge: 24 G Needle length: 10 cm Needle insertion depth: 5 cm Assessment Sensory level: T4 Events: CSF return Additional Notes 3  Attempt (s). Pt tolerated procedure well.

## 2024-06-15 DIAGNOSIS — D62 Acute posthemorrhagic anemia: Secondary | ICD-10-CM

## 2024-06-15 DIAGNOSIS — R000 Tachycardia, unspecified: Secondary | ICD-10-CM | POA: Diagnosis not present

## 2024-06-15 DIAGNOSIS — D539 Nutritional anemia, unspecified: Secondary | ICD-10-CM

## 2024-06-15 DIAGNOSIS — E871 Hypo-osmolality and hyponatremia: Secondary | ICD-10-CM | POA: Diagnosis not present

## 2024-06-15 LAB — CBC
HCT: 23.8 % — ABNORMAL LOW (ref 36.0–46.0)
Hemoglobin: 7.9 g/dL — ABNORMAL LOW (ref 12.0–15.0)
MCH: 33.5 pg (ref 26.0–34.0)
MCHC: 33.2 g/dL (ref 30.0–36.0)
MCV: 100.8 fL — ABNORMAL HIGH (ref 80.0–100.0)
Platelets: 192 10*3/uL (ref 150–400)
RBC: 2.36 MIL/uL — ABNORMAL LOW (ref 3.87–5.11)
RDW: 14.1 % (ref 11.5–15.5)
WBC: 6.1 10*3/uL (ref 4.0–10.5)
nRBC: 0 % (ref 0.0–0.2)

## 2024-06-15 MED ORDER — SODIUM CHLORIDE 0.9 % IV BOLUS
1000.0000 mL | Freq: Once | INTRAVENOUS | Status: AC
  Administered 2024-06-15: 1000 mL via INTRAVENOUS

## 2024-06-15 MED ORDER — SODIUM CHLORIDE 0.9 % IV SOLN: INTRAVENOUS | Status: AC

## 2024-06-15 NOTE — Progress Notes (Signed)
     Subjective:  Patient reports pain as moderate.  Discussed plan for mobilization with therapy today, recommended limited flexion first week with use of knee immobilizer for ambulation..  She reports she did sit at the bedside and stand overnight which went well.  Denies distal numbness and tingling.  No concerns.  Objective:   VITALS:   Vitals:   06/14/24 1804 06/14/24 2112 06/15/24 0020 06/15/24 0021  BP: 102/76 (!) 144/99 (!) 167/92   Pulse: (!) 102 (!) 108 (!) 112 (!) 108  Resp: 20 16 15    Temp: 97.7 F (36.5 C) 98.6 F (37 C) 98.4 F (36.9 C)   TempSrc:      SpO2: 100% 100% 99% 99%  Weight:      Height:        Sensation intact distally Intact pulses distally Dorsiflexion/Plantar flexion intact Incision: dressing C/D/I Compartment soft Wound vac holding suction  Lab Results  Component Value Date   WBC 4.6 06/14/2024   HGB 8.4 (L) 06/14/2024   HCT 25.5 (L) 06/14/2024   MCV 99.6 06/14/2024   PLT 196 06/14/2024   BMET    Component Value Date/Time   NA 129 (L) 06/14/2024 0718   K 3.7 06/14/2024 0718   CL 95 (L) 06/14/2024 0718   CO2 23 06/14/2024 0718   GLUCOSE 97 06/14/2024 0718   BUN 14 06/14/2024 0718   CREATININE 0.60 06/14/2024 0718   CALCIUM 8.8 (L) 06/14/2024 0718   GFRNONAA >60 06/14/2024 0718    Xray: Distal femur replacement components in good position no adverse features  Assessment/Plan: 1 Day Post-Op   Principal Problem:   Closed fracture of right distal femur (HCC) Active Problems:   Osteoporosis   Vitamin D  deficiency   Nicotine dependence   Status post right knee revision to distal femur replacement 06/14/2024  Post op recs: WB: WBAT-knee immobilizer for mobilization postop for soft tissue protection given very thin skin. Abx: ancef  and cefadroxil for extended prophylaxis postop IntraOp cultures no growth to date Imaging: PACU xrays DVT prophylaxis: Aspirin  81mg  BID x4 weeks Follow up: 2 weeks after surgery for a  wound check with Dr. Edna at [facility].  Address: [address], [place], [place] [address]  Office Phone: [phone]    TORIBIO DELENA EDNA 06/15/2024, 6:44 AM   TORIBIO Edna, MD  Contact information:   [phone] 7am-5pm epic message Dr. Edna, or call office for patient follow up: [phone] After hours and holidays please check [email or web address] for group call information for [facility]

## 2024-06-15 NOTE — Progress Notes (Addendum)
 Notified Dr Edna about elevated Heart rate of 120-125. Ordered to obtained EKG. Made MD aware of EKG results. No new orders at this time. Per MD to get consult to medicine.

## 2024-06-15 NOTE — Progress Notes (Addendum)
 0800- pt showed yellow mews due to Heart rate 124, BP 160/98, resp 20, temp 98.7, O2 97 %. pt noted to be anxious this morning with pain 10/10 on right knee. PRN ativan  for anxiety and dilaudid  for pain given. Pt also c/o increased weakness on both arms. Moderate grip present on both hands. Limited movement noted on left arm compared to right. No c/o chest pain. PA notified about increased heart rate and weakness on arms. No new orders at this time. Will continue to monitor.

## 2024-06-15 NOTE — Anesthesia Postprocedure Evaluation (Signed)
 Anesthesia Post Note  Patient: Madison Malone  Procedure(s) Performed: RIGHT KNEE REVISION TO DISTAL FEMUR REPLACEMENT (Right: Knee)     Patient location during evaluation: PACU Anesthesia Type: Spinal and Regional Level of consciousness: oriented and awake and alert Pain management: pain level controlled Vital Signs Assessment: post-procedure vital signs reviewed and stable Respiratory status: spontaneous breathing, respiratory function stable and patient connected to nasal cannula oxygen Cardiovascular status: blood pressure returned to baseline and stable Postop Assessment: no headache, no backache and no apparent nausea or vomiting Anesthetic complications: no  No notable events documented.  Last Vitals:  Vitals:   06/15/24 0653 06/15/24 0737  BP: (!) 175/96 (!) 160/98  Pulse:  (!) 124  Resp: 17 20  Temp: 37.2 C 37.1 C  SpO2: 97% 97%    Last Pain:  Vitals:   06/15/24 0751  TempSrc:   PainSc: 10-Worst pain ever   Pain Goal: Patients Stated Pain Goal: 2 (06/15/24 0523)                 Marien L Shakeila Pfarr

## 2024-06-15 NOTE — Consult Note (Addendum)
 Consultation Progress Note   Patient: Madison Malone FMW:[id number] DOB: [birth year] DOA: 06/10/2024 DOS: the patient was seen and examined on 06/15/2024 Primary service: Edna Toribio LABOR, MD  Brief hospital course: Ms. [name] is a 58 year old with history of tobacco use disorder and osteoporosis with a recent right periprosthetic distal femur fracture status post right revision total knee arthroplasty on 6/25. On postop day 1, patient found to have elevated heart rate with HR persistently in the 110s to 120s.  Patient given pain and anxiety medications without significant improvement in heart rate. EKG was done and TRH was consulted to assist with management of her tachycardia.  Assessment and Plan: # Tachycardia - Patient truly tachycardic since admission with HR persistently in the 90s to 110s but has increased to the 120s over the last 24 hours - EKG shows sinus tach with nonspecific T wave and ST changes - Patient with some baseline dyspnea on exertion but no chest pain, palpitations or shortness of breath at rest - Tachycardia likely multifactorial in the setting of mild dehydration and acute blood loss anemia - HR improved to the 100s after receiving IV NS 1 L bolus - Start IV NS 500 cc/h for 10 hours - Check TSH, mag and Phos - Patient currently on SCDs for DVT prophylaxis after recent surgery, can consider CTA chest PE study to rule out PE if tachycardia persist. She is currently not hypoxic and satting well on room air.  # Acute blood loss anemia # Macrocytic anemia - Hgb down to 7.9 from baseline of 11-13 - S/p 1 unit of PRBC on 6/24 - Acute on chronic anemia likely secondary to acute blood loss from recent surgery - Repeat 1 unit PRBC follow-up posttransfusion H&H - Check iron studies, ferritin and vitamin B12  # Acute on chronic hyponatremia - History of hyponatremia with baseline sodium in the low 130s - Sodium improved from 125->129 with IV hydration -  Likely hypovolemic hyponatremia - Continue IV NS infusion as above - Follow-up urine studies  # Vitamin D  deficiency - Vitamin D  levels low at 15.62 on 6/23 - Continue vitamin D  supplementation  # Anxiety - Continue as needed Ativan   # Right distal femur fracture # S/p total right knee arthroplasty # Right lower extremity pain - Management per surgical team     TRH will continue to follow the patient.  Subjective: Patient evaluated laying comfortably in bed.  Continues to endorse right lower extremity pain and generalized weakness.  She reports having dyspnea on exertion for the last few months but denies any chest pain, palpitations, dizziness, headache, nausea, vomiting or shortness of breath at rest.  Also reports that she was found to have a lung nodule but 6 months ago however repeat CT chest recently showed that this nodule has resolved.  Physical Exam: Vitals:   06/15/24 0653 06/15/24 0737 06/15/24 0950 06/15/24 1330  BP: (!) 175/96 (!) 160/98 (!) 157/92 (!) 159/85  Pulse:  (!) 124 (!) 122 (!) 120  Resp: 17 20 20 20   Temp: 98.9 F (37.2 C) 98.7 F (37.1 C) 99 F (37.2 C) 99.4 F (37.4 C)  TempSrc:   Oral Oral  SpO2: 97% 97% 100% 94%  Weight:      Height:       General: Pleasant, chronically ill elderly woman laying in bed. No acute distress. HEENT: [place]/AT. Anicteric sclera. Dry mucous membrane. CV: Tachycardic. Regular rhythm. No murmurs, rubs, or gallops. No LE edema Pulmonary: Lungs CTAB. Normal effort. No  wheezing or rales. Decreased breath sounds at the bases Abdominal: Soft, nontender, nondistended. Normal bowel sounds. MSK: RLE in knee immobilizer. Moderate ttp. Neurovascular intact distally Skin: Warm and dry. No obvious rash or lesions. Neuro: A&Ox3. Generalized weakness. Normal sensation to light touch. No focal deficit. Psych: Normal mood and affect  Data Reviewed:  VAS US  ABI WITH/WO TBI Result Date: 06/11/2024  LOWER EXTREMITY DOPPLER STUDY  Patient Name:  Madison Malone  Date of Exam:   06/10/2024 Medical Rec #: [id number]          Accession #:    [id number] Date of Birth: 1966-09-16          Patient Gender: F Patient Age:   34 years Exam Location:  [facility] Procedure:      VAS US  ABI WITH/WO TBI Referring Phys: FRANCIS MT --------------------------------------------------------------------------------  Indications: Diminished pulses, mottled/purple feet. Edema. Right supracondylar              femur fracture. Right THA 2023, multiple right knee surgeries. High Risk Factors: Current smoker. Osteoporosis. Other Factors: Complex periprosthetic fracture.  Comparison Study: No prior ABI on file Performing Technologist: Rachel Pellet RVS  Examination Guidelines: A complete evaluation includes at minimum, Doppler waveform signals and systolic blood pressure reading at the level of bilateral brachial, anterior tibial, and posterior tibial arteries, when vessel segments are accessible. Bilateral testing is considered an integral part of a complete examination. Photoelectric Plethysmograph (PPG) waveforms and toe systolic pressure readings are included as required and additional duplex testing as needed. Limited examinations for reoccurring indications may be performed as noted.  ABI Findings: +---------+------------------+-----+-----------+--------+ Right    Rt Pressure (mmHg)IndexWaveform   Comment  +---------+------------------+-----+-----------+--------+ Brachial 128                    triphasic           +---------+------------------+-----+-----------+--------+ PTA      164               1.21 multiphasic         +---------+------------------+-----+-----------+--------+ DP       140               1.03 multiphasic         +---------+------------------+-----+-----------+--------+ Great Toe102               0.75 Abnormal            +---------+------------------+-----+-----------+--------+  +---------+------------------+-----+-----------+-------+ Left     Lt Pressure (mmHg)IndexWaveform   Comment +---------+------------------+-----+-----------+-------+ Brachial 136                    triphasic          +---------+------------------+-----+-----------+-------+ PTA      192               1.41 multiphasic        +---------+------------------+-----+-----------+-------+ DP       187               1.38 multiphasic        +---------+------------------+-----+-----------+-------+ Great Toe126               0.93 Abnormal           +---------+------------------+-----+-----------+-------+ +-------+-------------------+-----------+------------+------------+ ABI/TBIToday's ABI        Today's TBIPrevious ABIPrevious TBI +-------+-------------------+-----------+------------+------------+ Right  1.2                0.75                                +-------+-------------------+-----------+------------+------------+  Left   1.4 noncompressible0.93                                +-------+-------------------+-----------+------------+------------+ Arterial wall calcification precludes accurate ankle pressures and ABIs.  Summary: Right: Resting right ankle-brachial index is within normal range. The right toe-brachial index is normal. Normal TBI with abnormal waveform. Left: Resting left ankle-brachial index indicates noncompressible left lower extremity arteries. The left toe-brachial index is normal. Normal TBI with abnormal waveform. *See table(s) above for measurements and observations.  Electronically signed by Penne Colorado MD on 06/11/2024 at 10:30:05 AM.    Final    CT KNEE RIGHT WO CONTRAST Result Date: 06/10/2024 CLINICAL DATA:  Periprosthetic distal femoral fracture EXAM: CT OF THE RIGHT KNEE WITHOUT CONTRAST TECHNIQUE: Multidetector CT imaging of the right knee was performed according to the standard protocol. Multiplanar CT image reconstructions were also generated.  RADIATION DOSE REDUCTION: This exam was performed according to the departmental dose-optimization program which includes automated exposure control, adjustment of the mA and/or kV according to patient size and/or use of iterative reconstruction technique. COMPARISON:  Right knee radiograph dated 06/09/2024 FINDINGS: Bones/Joint/Cartilage Prior right knee arthroplasty. The tibial component appears intact and well seated. The femoral component also appears intact, however a comminuted distal femoral metadiaphyseal fracture extends into the interface with the femoral component. There is impaction and 1/2 shaft width posterior and lateral displacement of the dominant fracture fragment. Ligaments Suboptimally assessed by CT. Muscles and Tendons Grossly intact, although the distal femoral thigh musculature is expanded and heterogeneous, related to intramuscular hematoma. Soft tissues Diffuse soft tissue edema about the knee. Suspected joint effusion, although suboptimally evaluated due to beam hardening artifact from the knee arthroplasty. IMPRESSION: 1. Comminuted, impacted, and displaced distal femoral metadiaphyseal fracture extends into the interface with the femoral component of the right knee arthroplasty. 2. Intramuscular hematoma of the distal femoral thigh. Electronically Signed   By: Limin  Xu M.D.   On: 06/10/2024 10:05   DG Hip Unilat W or Wo Pelvis 2-3 Views Right Result Date: 06/10/2024 CLINICAL DATA:  Fall, right distal femoral fracture. EXAM: DG HIP (WITH OR WITHOUT PELVIS) 2-3V RIGHT COMPARISON:  None Available. FINDINGS: Right total hip arthroplasty has been performed. No acute fracture or dislocation. Sacroiliac and left hip joint spaces are preserved. Soft tissues are unremarkable. IMPRESSION: 1. Right total hip arthroplasty. No acute fracture or dislocation. Electronically Signed   By: Dorethia Molt M.D.   On: 06/10/2024 00:33    Family Communication: No family at bedside  Time spent: 60  minutes.  Author: Claretta CHRISTELLA Alderman, MD 06/15/2024 7:22 PM  For on call review [email or web address].

## 2024-06-15 NOTE — Evaluation (Signed)
 Physical Therapy Evaluation Patient Details Name: Madison Malone MRN: [id number] DOB: November 26, 1966 Today's Date: 06/15/2024  History of Present Illness  Pt is a 58 year old female admitted after a fall resulting in comminuted, impacted, and displaced distal femoral metadiaphyseal  fracture extends into the interface with the femoral component of the right knee arthroplasty, and R distal thigh intramuscular hematoma and currently s/p Status post right knee revision to distal femur replacement 06/14/2024.  Past medical history of multiple knee and hip surgeries, Anxiety, Arthritis, Chronic low back pain, GERD (gastroesophageal reflux disease), and Osteoporosis.  Clinical Impression  Patient is s/p above surgery resulting in functional limitations due to the deficits listed below (see PT Problem List).  Patient will benefit from acute skilled PT to increase their independence and safety with mobility to facilitate discharge.  Pt requesting assist to use BSC on arrival to room.  Pt somewhat needed OOB urgently so reviewed precautions during mobilization.  Pt requiring mod assist to transfers to/from [facility] due to weakness, pain, fatigue.  Pt requesting more pain meds however RN reports not due yet.  Reapplied ice packs to right knee and reviewed KI use end of session as friends arrived (that will also be assisting pt upon d/c).  Pt's HR also elevated to 135 bpm with transfer and RN aware.  Pt plans to return home with assist from family and friends upon d/c.          If plan is discharge home, recommend the following: A little help with walking and/or transfers;A little help with bathing/dressing/bathroom;Assistance with cooking/housework;Assist for transportation;Help with stairs or ramp for entrance   Can travel by private vehicle        Equipment Recommendations BSC/3in1 (however pt plans to borrow [facility] from family)  Recommendations for Other Services       Functional Status Assessment Patient has  had a recent decline in their functional status and demonstrates the ability to make significant improvements in function in a reasonable and predictable amount of time.     Precautions / Restrictions Precautions Precautions: Fall Precaution/Restrictions Comments: per orders: Do not bend right knee more than 45 degrees for 1-2 weeks. Utilize knee immobilizer when resting and WB. Required Braces or Orthoses: Knee Immobilizer - Right Knee Immobilizer - Right: On at all times Restrictions RLE Weight Bearing Per Provider Order: Weight bearing as tolerated      Mobility  Bed Mobility Overal bed mobility: Needs Assistance Bed Mobility: Supine to Sit, Sit to Supine     Supine to sit: Min assist Sit to supine: Min assist   General bed mobility comments: assist for R LE    Transfers Overall transfer level: Needs assistance Equipment used: Rolling walker (2 wheels) Transfers: Sit to/from Stand, Bed to chair/wheelchair/BSC Sit to Stand: Min assist, Mod assist Stand pivot transfers: Mod assist         General transfer comment: cues for hand placement and safety; initially min assist for OOB to [facility] however mod assist upon return to bed due to pt fatigue and pain; HR also elevated to 135 bpm with transfer and RN notified    Ambulation/Gait                  Stairs            Wheelchair Mobility     Tilt Bed    Modified Rankin (Stroke Patients Only)       Balance Overall balance assessment: Needs assistance  Standing balance support: Bilateral upper extremity supported, During functional activity, Reliant on assistive device for balance Standing balance-Leahy Scale: Zero                               Pertinent Vitals/Pain Pain Assessment Pain Assessment: 0-10 Pain Score: 10-Worst pain ever Pain Location: left knee Pain Descriptors / Indicators: Sore, Aching, Grimacing, Guarding Pain Intervention(s): Repositioned, Monitored during  session, Ice applied (RN reports not due for more pain meds)    Home Living Family/patient expects to be discharged to:: Private residence Living Arrangements: Alone Available Help at Discharge: Family;Friend(s);Available 24 hours/day Type of Home: House Home Access: Stairs to enter Entrance Stairs-Rails: Right Entrance Stairs-Number of Steps: 5   Home Layout: One level Home Equipment: Agricultural consultant (2 wheels);Cane - single point Additional Comments: plans to borrow a [facility]    Prior Function Prior Level of Function : Independent/Modified Independent                     Extremity/Trunk Assessment   Upper Extremity Assessment Upper Extremity Assessment: Generalized weakness    Lower Extremity Assessment Lower Extremity Assessment: RLE deficits/detail RLE Deficits / Details: maintained KI and educated on precautions, oberved right foot swelling and encouraged elevation with straight knee and ankle pumps       Communication   Communication Communication: No apparent difficulties    Cognition Arousal: Suspect due to medications (drowsy) Behavior During Therapy: WFL for tasks assessed/performed   PT - Cognitive impairments: Problem solving, Safety/Judgement                       PT - Cognition Comments: cues for safety and technique; may be due to urgency as pt reports calling out to use bathroom an hour ago         Cueing Cueing Techniques: Verbal cues, Tactile cues     General Comments      Exercises     Assessment/Plan    PT Assessment Patient needs continued PT services  PT Problem List Decreased strength;Decreased activity tolerance;Decreased range of motion;Decreased mobility;Decreased safety awareness;Decreased knowledge of precautions;Decreased knowledge of use of DME;Decreased balance;Pain       PT Treatment Interventions Functional mobility training;Stair training;Gait training;DME instruction;Balance training;Therapeutic  exercise;Therapeutic activities;Patient/family education    PT Goals (Current goals can be found in the Care Plan section)  Acute Rehab PT Goals PT Goal Formulation: With patient Time For Goal Achievement: 06/29/24 Potential to Achieve Goals: Good    Frequency 7X/week     Co-evaluation               AM-PAC PT 6 Clicks Mobility  Outcome Measure Help needed turning from your back to your side while in a flat bed without using bedrails?: A Little Help needed moving from lying on your back to sitting on the side of a flat bed without using bedrails?: A Little Help needed moving to and from a bed to a chair (including a wheelchair)?: A Lot Help needed standing up from a chair using your arms (e.g., wheelchair or bedside chair)?: A Lot Help needed to walk in hospital room?: A Lot Help needed climbing 3-5 steps with a railing? : Total 6 Click Score: 13    End of Session Equipment Utilized During Treatment: Gait belt Activity Tolerance: Patient limited by fatigue;Patient limited by pain Patient left: in bed;with call bell/phone within reach;with bed alarm set;with family/visitor  present Nurse Communication: Mobility status;Patient requests pain meds PT Visit Diagnosis: Muscle weakness (generalized) (M62.81);Difficulty in walking, not elsewhere classified (R26.2);Unsteadiness on feet (R26.81)    Time: 8984-8961 PT Time Calculation (min) (ACUTE ONLY): 23 min   Charges:   PT Evaluation $PT Eval Low Complexity: 1 Low PT Treatments $Therapeutic Activity: 8-22 mins PT General Charges $$ ACUTE PT VISIT: 1 Visit       Madison PT, DPT Physical Therapist Acute Rehabilitation Services Office: [phone]   Madison Malone [place] 06/15/2024, 12:56 PM

## 2024-06-15 NOTE — TOC Initial Note (Signed)
 Transition of Care [facility]) - Initial/Assessment Note    Patient Details  Name: Madison Malone MRN: [id number] Date of Birth: 02-Oct-1966  Transition of [facility]) CM/SW Contact:    Alfonse JONELLE Rex, RN Phone Number: 06/15/2024, 9:51 AM  Clinical Narrative:  Met with patient at bedside to introduce role of TOC/NCM and review for dc planning, pt confirmed she lives alone with support from neighbors, friends and family, reports no current home care services, home DME: RW,  BSC. PT eval, await recommendation. TOC will continue to follow.               Expected Discharge Plan: Home w Home Health Services Barriers to Discharge: Continued Medical Work up   Patient Goals and CMS Choice Patient states their goals for this hospitalization and ongoing recovery are:: return home          Expected Discharge Plan and Services       Living arrangements for the past 2 months: Single Family Home                                      Prior Living Arrangements/Services Living arrangements for the past 2 months: Single Family Home Lives with:: Self Patient language and need for interpreter reviewed:: Yes Do you feel safe going back to the place where you live?: Yes      Need for Family Participation in Patient Care: Yes (Comment) Care giver support system in place?: Yes (comment) Current home services: DME (RW, BSC) Criminal Activity/Legal Involvement Pertinent to Current Situation/Hospitalization: No - Comment as needed  Activities of Daily Living   ADL Screening (condition at time of admission) Independently performs ADLs?: Yes (appropriate for developmental age) Is the patient deaf or have difficulty hearing?: No Does the patient have difficulty seeing, even when wearing glasses/contacts?: No Does the patient have difficulty concentrating, remembering, or making decisions?: No  Permission Sought/Granted                  Emotional Assessment Appearance:: Appears stated  age Attitude/Demeanor/Rapport: Gracious Affect (typically observed): Accepting Orientation: : Oriented to Self, Oriented to Place, Oriented to  Time, Oriented to Situation Alcohol / Substance Use: Not Applicable Psych Involvement: No (comment)  Admission diagnosis:  Closed fracture of right distal femur (HCC) [S72.401A] Closed fracture of lower end of right femur (HCC) [S72.401A] Closed fracture of distal end of right femur, unspecified fracture morphology, initial encounter [facility]) [S72.401A] Patient Active Problem List   Diagnosis Date Noted   Nicotine dependence 06/11/2024   Closed fracture of right distal femur (HCC) 06/10/2024   Lung nodule 08/03/2023   Vitamin D  deficiency 08/24/2022   Closed displaced fracture of right femoral neck (HCC) 08/22/2022   Osteoporosis 08/22/2022   PCP:  [name] ORN, MD Pharmacy:   [phone], [place] - [address] [place] [address] Phone: [phone] Fax: [phone]     Social Drivers of Health (SDOH) Social History: SDOH Screenings   Food Insecurity: No Food Insecurity (06/11/2024)  Housing: Low Risk  (06/11/2024)  Transportation Needs: No Transportation Needs (06/11/2024)  Utilities: Not At Risk (06/11/2024)  Depression (PHQ2-9): Low Risk  (07/21/2023)  Financial Resource Strain: Low Risk  (07/21/2023)  Tobacco Use: High Risk (06/14/2024)   SDOH Interventions:     Readmission Risk Interventions    06/15/2024    9:49 AM  Readmission Risk Prevention  Plan  Post Dischage Appt Complete  Medication Screening Complete  Transportation Screening Complete

## 2024-06-15 NOTE — Consult Note (Incomplete)
 Consultation Progress Note   Patient: Madison Malone FMW:[id number] DOB: [birth year] DOA: 06/10/2024 DOS: the patient was seen and examined on 06/15/2024 Primary service: Edna Toribio LABOR, MD  Brief hospital course: No notes on file  Assessment and Plan: No notes have been filed under this hospital service. Service: Hospitalist    {Tip this will not be part of the note when signed  DVT Prophylaxis  ., Scds  (Optional):26781}   {TRH_Follow_Sign_off:26779}  Subjective: ***  Physical Exam: Vitals:   06/15/24 0653 06/15/24 0737 06/15/24 0950 06/15/24 1330  BP: (!) 175/96 (!) 160/98 (!) 157/92 (!) 159/85  Pulse:  (!) 124 (!) 122 (!) 120  Resp: 17 20 20 20   Temp: 98.9 F (37.2 C) 98.7 F (37.1 C) 99 F (37.2 C) 99.4 F (37.4 C)  TempSrc:   Oral Oral  SpO2: 97% 97% 100% 94%  Weight:      Height:       *** Data Reviewed: {Tip this will not be part of the note when signed- Document your independent interpretation of telemetry tracing, EKG, lab, Radiology test or any other diagnostic tests. Add any new diagnostic test ordered today. (Optional):26781} {Results:26384}  Family Communication: ***  Time spent: *** minutes.  Author: Claretta CHRISTELLA Alderman, MD 06/15/2024 7:22 PM  For on call review [email or web address].

## 2024-06-15 NOTE — Progress Notes (Addendum)
 Pt's heart rate continues to be at 120-125 at rest. No c/o chest pain or distress. Called Dr's office to get in touch with PA or Dr. Edna but unable to get connected. Was on hold for 2 times and no answer received.

## 2024-06-16 ENCOUNTER — Inpatient Hospital Stay (HOSPITAL_COMMUNITY)

## 2024-06-16 DIAGNOSIS — E559 Vitamin D deficiency, unspecified: Secondary | ICD-10-CM

## 2024-06-16 DIAGNOSIS — D62 Acute posthemorrhagic anemia: Secondary | ICD-10-CM | POA: Diagnosis not present

## 2024-06-16 DIAGNOSIS — F17213 Nicotine dependence, cigarettes, with withdrawal: Secondary | ICD-10-CM

## 2024-06-16 DIAGNOSIS — E871 Hypo-osmolality and hyponatremia: Secondary | ICD-10-CM

## 2024-06-16 DIAGNOSIS — D539 Nutritional anemia, unspecified: Secondary | ICD-10-CM

## 2024-06-16 DIAGNOSIS — S72401A Unspecified fracture of lower end of right femur, initial encounter for closed fracture: Secondary | ICD-10-CM

## 2024-06-16 DIAGNOSIS — R062 Wheezing: Secondary | ICD-10-CM

## 2024-06-16 DIAGNOSIS — R000 Tachycardia, unspecified: Secondary | ICD-10-CM

## 2024-06-16 LAB — OSMOLALITY, URINE: Osmolality, Ur: 137 mosm/kg — ABNORMAL LOW (ref 300–900)

## 2024-06-16 LAB — PHOSPHORUS: Phosphorus: 2.9 mg/dL (ref 2.5–4.6)

## 2024-06-16 LAB — BASIC METABOLIC PANEL WITH GFR
Anion gap: 15 (ref 5–15)
BUN: 6 mg/dL (ref 6–20)
CO2: 22 mmol/L (ref 22–32)
Calcium: 8.4 mg/dL — ABNORMAL LOW (ref 8.9–10.3)
Chloride: 92 mmol/L — ABNORMAL LOW (ref 98–111)
Creatinine, Ser: 0.65 mg/dL (ref 0.44–1.00)
GFR, Estimated: >60 mL/min (ref >60)
Glucose, Bld: 114 mg/dL — ABNORMAL HIGH (ref 70–99)
Potassium: 3.4 mmol/L — ABNORMAL LOW (ref 3.5–5.1)
Sodium: 129 mmol/L — ABNORMAL LOW (ref 135–145)

## 2024-06-16 LAB — IRON AND TIBC
Iron: 12 ug/dL — ABNORMAL LOW (ref 28–170)
Saturation Ratios: 5 % — ABNORMAL LOW (ref 10.4–31.8)
TIBC: 225 ug/dL — ABNORMAL LOW (ref 250–450)
UIBC: 213 ug/dL

## 2024-06-16 LAB — CBC
HCT: 27.8 % — ABNORMAL LOW (ref 36.0–46.0)
Hemoglobin: 9.2 g/dL — ABNORMAL LOW (ref 12.0–15.0)
MCH: 33.8 pg (ref 26.0–34.0)
MCHC: 33.1 g/dL (ref 30.0–36.0)
MCV: 102.2 fL — ABNORMAL HIGH (ref 80.0–100.0)
Platelets: 230 10*3/uL (ref 150–400)
RBC: 2.72 MIL/uL — ABNORMAL LOW (ref 3.87–5.11)
RDW: 13.9 % (ref 11.5–15.5)
WBC: 7 10*3/uL (ref 4.0–10.5)
nRBC: 0 % (ref 0.0–0.2)

## 2024-06-16 LAB — SODIUM, URINE, RANDOM: Sodium, Ur: 15 mmol/L

## 2024-06-16 LAB — MAGNESIUM: Magnesium: 1.6 mg/dL — ABNORMAL LOW (ref 1.7–2.4)

## 2024-06-16 LAB — HEMOGLOBIN AND HEMATOCRIT, BLOOD
HCT: 27.2 % — ABNORMAL LOW (ref 36.0–46.0)
Hemoglobin: 9.2 g/dL — ABNORMAL LOW (ref 12.0–15.0)

## 2024-06-16 LAB — PREPARE RBC (CROSSMATCH)

## 2024-06-16 LAB — FERRITIN: Ferritin: 334 ng/mL — ABNORMAL HIGH (ref 11–307)

## 2024-06-16 LAB — TSH: TSH: 2.654 u[IU]/mL (ref 0.350–4.500)

## 2024-06-16 LAB — VITAMIN B12: Vitamin B-12: 718 pg/mL (ref 180–914)

## 2024-06-16 MED ORDER — POTASSIUM CHLORIDE CRYS ER 20 MEQ PO TBCR
40.0000 meq | EXTENDED_RELEASE_TABLET | Freq: Once | ORAL | Status: AC
Start: 2024-06-16 — End: 2024-06-16
  Administered 2024-06-16: 40 meq via ORAL
  Filled 2024-06-16: qty 2

## 2024-06-16 MED ORDER — SODIUM CHLORIDE 0.9% IV SOLUTION: Freq: Once | INTRAVENOUS | Status: AC

## 2024-06-16 MED ORDER — FERROUS SULFATE 325 (65 FE) MG PO TABS
325.0000 mg | ORAL_TABLET | Freq: Every day | ORAL | Status: DC
  Administered 2024-06-17 – 2024-06-19 (×3): 325 mg via ORAL
  Filled 2024-06-16 (×3): qty 1

## 2024-06-16 MED ORDER — MAGNESIUM SULFATE 2 GM/50ML IV SOLN
2.0000 g | Freq: Once | INTRAVENOUS | Status: AC
  Administered 2024-06-16: 2 g via INTRAVENOUS
  Filled 2024-06-16: qty 50

## 2024-06-16 NOTE — Progress Notes (Incomplete)
 PROGRESS NOTE   Madison Malone  FMW:[id number] DOB: 1966-01-22 DOA: 06/10/2024 PCP: [name] ORN, MD   Date of Service: the patient was seen and examined on 06/16/2024  Brief Narrative:  No notes on file   Assessment & Plan Closed fracture of right distal femur (HCC)  Osteoporosis  Vitamin D  deficiency  Nicotine dependence  Tachycardia  Acute postoperative anemia due to expected blood loss  Macrocytic anemia  Hyponatremia      Subjective:  ***  Physical Exam:  Vitals:   06/16/24 0605 06/16/24 1356 06/16/24 1740 06/16/24 2126  BP: (!) 146/91 99/65 121/72 (!) 105/93  Pulse: (!) 110 (!) 120 (!) 104 (!) 103  Resp: 18 16 16 18   Temp: 98.1 F (36.7 C) 98.7 F (37.1 C) 98.3 F (36.8 C) 98.3 F (36.8 C)  TempSrc: Oral   Oral  SpO2: 100% 99% 99% 94%  Weight:      Height:        *** Constitutional: Awake alert and oriented x3, no associated distress.   Skin: no rashes, no lesions, good skin turgor noted. Eyes: Pupils are equally reactive to light.  No evidence of scleral icterus or conjunctival pallor.  ENMT: Moist mucous membranes noted.  Posterior pharynx clear of any exudate or lesions.   Respiratory: clear to auscultation bilaterally, no wheezing, no crackles. Normal respiratory effort. No accessory muscle use.  Cardiovascular: Regular rate and rhythm, no murmurs / rubs / gallops. No extremity edema. 2+ pedal pulses. No carotid bruits.  Abdomen: Abdomen is soft and nontender.  No evidence of intra-abdominal masses.  Positive bowel sounds noted in all quadrants.   Musculoskeletal: No joint deformity upper and lower extremities. Good ROM, no contractures. Normal muscle tone.    Data Reviewed:  I have personally reviewed and interpreted labs, imaging.  Significant findings are ***  CBC: Recent Labs  Lab 06/10/24 0023 06/11/24 9367 06/13/24 0612 06/13/24 1941 06/14/24 0718 06/15/24 0633 06/16/24 0127 06/16/24 0825  WBC 7.6 5.0 4.8  --   4.6 6.1 7.0  --   NEUTROABS 6.0  --   --   --   --   --   --   --   HGB 11.0* 8.5* 6.9* 8.5* 8.4* 7.9* 9.2* 9.2*  HCT 33.1* 24.3* 19.8* 24.4* 25.5* 23.8* 27.8* 27.2*  MCV 104.4* 101.7* 100.5*  --  99.6 100.8* 102.2*  --   PLT 244 194 188  --  196 192 230  --    Basic Metabolic Panel: Recent Labs  Lab 06/10/24 0023 06/11/24 0632 06/13/24 0612 06/14/24 0718 06/16/24 0127  NA 135 125* 125* 129* 129*  K 3.7 4.1 3.8 3.7 3.4*  CL 105 96* 94* 95* 92*  CO2 20* 24 22 23 22   GLUCOSE 97 93 90 97 114*  BUN 12 9 10 14 6   CREATININE 0.53 0.57 0.67 0.60 0.65  CALCIUM 8.2* 8.2* 8.5* 8.8* 8.4*  MG  --   --   --   --  1.6*  PHOS  --   --   --   --  2.9   GFR: Estimated Creatinine Clearance: 61.2 mL/min (by C-G formula based on SCr of 0.65 mg/dL). Liver Function Tests: Recent Labs  Lab 06/11/24 0632  AST 29  ALT 16  ALKPHOS 48  BILITOT 0.5  PROT 5.9*  ALBUMIN  3.2*    Coagulation Profile: No results for input(s): INR, PROTIME in the last 168 hours.   EKG/Telemetry: Personally reviewed.  Rhythm is *** with heart  rate of ***.  No dynamic ST segment changes appreciated.   Code Status:  {Palliative Code status:23503}.  Code status decision has been confirmed with: *** Family Communication: ***    Severity of Illness:  {Observation/Inpatient:21159}  Time spent:  *** minutes  Author:  Zachary JINNY Ba MD  06/16/2024 11:13 PM

## 2024-06-16 NOTE — Plan of Care (Signed)
  Problem: Safety: Goal: Ability to remain free from injury will improve Outcome: Progressing   Problem: Pain Managment: Goal: General experience of comfort will improve and/or be controlled Outcome: Progressing   Problem: Elimination: Goal: Will not experience complications related to urinary retention Outcome: Progressing   Problem: Coping: Goal: Level of anxiety will decrease Outcome: Progressing   Problem: Activity: Goal: Risk for activity intolerance will decrease Outcome: Progressing

## 2024-06-16 NOTE — Progress Notes (Signed)
     Subjective:  Patient reports pain as moderate. Worked well with PT yesterday. Feels better this morning after blood transfusion and additional IV fluids. Tachycardia improved.  Denies distal numbness and tingling.  No concerns.  Objective:   VITALS:   Vitals:   06/16/24 0140 06/16/24 0325 06/16/24 0347 06/16/24 0605  BP: 114/71 (!) 144/85 134/79 (!) 146/91  Pulse: (!) 109 (!) 108 (!) 102 (!) 110  Resp: 18 16 15 18   Temp: 98.1 F (36.7 C) 98.4 F (36.9 C) 98.9 F (37.2 C) 98.1 F (36.7 C)  TempSrc: Oral Oral Oral Oral  SpO2: 100% 94% 99% 100%  Weight:      Height:        Sensation intact distally Intact pulses distally Dorsiflexion/Plantar flexion intact Incision: dressing C/D/I Compartment soft Wound vac holding suction  Lab Results  Component Value Date   WBC 7.0 06/16/2024   HGB 9.2 (L) 06/16/2024   HCT 27.8 (L) 06/16/2024   MCV 102.2 (H) 06/16/2024   PLT 230 06/16/2024   BMET    Component Value Date/Time   NA 129 (L) 06/16/2024 0127   K 3.4 (L) 06/16/2024 0127   CL 92 (L) 06/16/2024 0127   CO2 22 06/16/2024 0127   GLUCOSE 114 (H) 06/16/2024 0127   BUN 6 06/16/2024 0127   CREATININE 0.65 06/16/2024 0127   CALCIUM 8.4 (L) 06/16/2024 0127   GFRNONAA >60 06/16/2024 0127    Xray: Distal femur replacement components in good position no adverse features  Assessment/Plan: 2 Days Post-Op   Principal Problem:   Closed fracture of right distal femur (HCC) Active Problems:   Osteoporosis   Vitamin D  deficiency   Nicotine dependence   Tachycardia   Acute postoperative anemia due to expected blood loss   Macrocytic anemia   Hyponatremia   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 Recs appreciated.  Post op recs: WB: WBAT-knee immobilizer for mobilization postop for soft tissue protection given very thin skin. Abx: ancef  and  cefadroxil  for extended prophylaxis postop IntraOp cultures no growth to date Imaging: PACU xrays DVT prophylaxis: Aspirin  81mg  BID x4 weeks Follow up: 2 weeks after surgery for a wound check with Dr. Edna at [facility].  Address: [address], [place], [place] [address]  Office Phone: [phone]    TORIBIO DELENA EDNA 06/16/2024, 7:44 AM   TORIBIO Edna, MD  Contact information:   [phone] 7am-5pm epic message Dr. Edna, or call office for patient follow up: [phone] After hours and holidays please check [email or web address] for group call information for [facility]

## 2024-06-16 NOTE — Progress Notes (Signed)
 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  Past medical history of multiple knee and hip surgeries, Anxiety, Arthritis, Chronic low back pain, GERD (gastroesophageal reflux disease), and Osteoporosis.    PT Comments  Pt assisted with ambulating in hallway.  Reviewed use of KI and provided fresh ice packs end of session.  Pt also educated on ankle pumps, quad sets, SLR, and hip abduction x10 (with gait belt to assist as needed) as pt requesting supine exercises she can perform during the day. Pt confirms she aware of precaution/restriction of limited knee flexion at this time.    If plan is discharge home, recommend the following: A little help with walking and/or transfers;A little help with bathing/dressing/bathroom;Assistance with cooking/housework;Assist for transportation;Help with stairs or ramp for entrance   Can travel by private vehicle        Equipment Recommendations  BSC/3in1 (however pt plans to borrow [facility] from family)    Recommendations for Other Services       Precautions / Restrictions Precautions Precautions: Fall Precaution/Restrictions Comments: per orders: Do not bend right knee more than 45 degrees for 1-2 weeks. Utilize knee immobilizer when resting and WB. Required Braces or Orthoses: Knee Immobilizer - Right Knee Immobilizer - Right: On at all times Restrictions RLE Weight Bearing Per Provider Order: Weight bearing as tolerated     Mobility  Bed Mobility Overal bed mobility: Needs Assistance Bed  Mobility: Supine to Sit, Sit to Supine     Supine to sit: Min assist Sit to supine: Min assist   General bed mobility comments: assist for R LE    Transfers Overall transfer level: Needs assistance Equipment used: Rolling walker (2 wheels) Transfers: Sit to/from Stand Sit to Stand: Min assist           General transfer comment: cues for hand placement and safety    Ambulation/Gait Ambulation/Gait assistance: Contact guard assist Gait Distance (Feet): 160 Feet Assistive device: Rolling walker (2 wheels) Gait Pattern/deviations: Step-to pattern, Decreased stance time - right, Antalgic       General Gait Details: verbal cues for sequence, RW positioning, neutral alignment (R toe in observed); denies any symptoms, HR 135 bpm upon return to sitting on bed and decreased to 118 bpm with supine position   Stairs             Wheelchair Mobility     Tilt Bed    Modified Rankin (Stroke Patients Only)       Balance                                            Communication Communication Communication: No apparent difficulties  Cognition Arousal: Alert Behavior During Therapy: WFL for tasks assessed/performed   PT - Cognitive impairments: Problem solving, Safety/Judgement                       PT - Cognition Comments: cues for safety and technique; pt refuses to wear grippy socks - barefoot  at home all the time        Cueing    Exercises      General Comments        Pertinent Vitals/Pain Pain Assessment Pain Assessment: 0-10 Pain Score: 8  Pain Location: left knee Pain Descriptors / Indicators: Sore, Aching, Grimacing, Guarding Pain Intervention(s): Monitored during session, Repositioned, RN gave pain meds during session, Ice applied    Home Living                          Prior Function            PT Goals (current goals can now be found in the care plan section) Progress towards PT goals: Progressing  toward goals    Frequency    7X/week      PT Plan      Co-evaluation              AM-PAC PT 6 Clicks Mobility   Outcome Measure  Help needed turning from your back to your side while in a flat bed without using bedrails?: A Little Help needed moving from lying on your back to sitting on the side of a flat bed without using bedrails?: A Little Help needed moving to and from a bed to a chair (including a wheelchair)?: A Little Help needed standing up from a chair using your arms (e.g., wheelchair or bedside chair)?: A Little Help needed to walk in hospital room?: A Little Help needed climbing 3-5 steps with a railing? : Total 6 Click Score: 16    End of Session Equipment Utilized During Treatment: Gait belt;Right knee immobilizer Activity Tolerance: Patient tolerated treatment well Patient left: in bed;with call bell/phone within reach;with bed alarm set Nurse Communication: Mobility status PT Visit Diagnosis: Difficulty in walking, not elsewhere classified (R26.2);Unsteadiness on feet (R26.81)     Time: 1001-1030 PT Time Calculation (min) (ACUTE ONLY): 29 min  Charges:    $Gait Training: 23-37 mins PT General Charges $$ ACUTE PT VISIT: 1 Visit                     Tari PT, DPT Physical Therapist Acute Rehabilitation Services Office: [phone]    Tari CROME Payson 06/16/2024, 2:17 PM

## 2024-06-16 NOTE — Progress Notes (Signed)
 PT Cancellation Note  Patient Details Name: Madison Malone MRN: [id number] DOB: [birth year]   Cancelled Treatment:    Reason Eval/Treat Not Completed: Other (comment)  Pt reports she already ambulated with NT again today and has been performing LE exercises in bed (as discussed this morning).   Tari CROME Payson 06/16/2024, 2:05 PM Tari KLEIN, DPT Physical Therapist Acute Rehabilitation Services Office: [phone]

## 2024-06-16 NOTE — TOC Progression Note (Addendum)
 Transition of Care [facility]) - Progression Note    Patient Details  Name: Madison Malone MRN: [id number] Date of Birth: Jun 27, 1966  [facility] of [facility]) CM/SW Contact  Alfonse JONELLE Rex, RN Phone Number: 06/16/2024, 1:27 PM  Clinical Narrative:  PT eval completed, no recommendations. Teams chat to Dr Montine for dc  therapy plans, states patient wants to go home so probably home health. TOC will continue to follow.         Expected Discharge Plan: Home w Home Health Services Barriers to Discharge: Continued Medical Work up  Expected Discharge Plan and Services       Living arrangements for the past 2 months: Single Family Home                                       Social Determinants of Health (SDOH) Interventions SDOH Screenings   Food Insecurity: No Food Insecurity (06/11/2024)  Housing: Low Risk  (06/11/2024)  Transportation Needs: No Transportation Needs (06/11/2024)  Utilities: Not At Risk (06/11/2024)  Depression (PHQ2-9): Low Risk  (07/21/2023)  Financial Resource Strain: Low Risk  (07/21/2023)  Tobacco Use: High Risk (06/14/2024)    Readmission Risk Interventions    06/15/2024    9:49 AM  Readmission Risk Prevention Plan  Post Dischage Appt Complete  Medication Screening Complete  Transportation Screening Complete

## 2024-06-16 NOTE — Plan of Care (Signed)

## 2024-06-17 ENCOUNTER — Inpatient Hospital Stay (HOSPITAL_COMMUNITY)

## 2024-06-17 DIAGNOSIS — M7989 Other specified soft tissue disorders: Secondary | ICD-10-CM | POA: Diagnosis not present

## 2024-06-17 DIAGNOSIS — J449 Chronic obstructive pulmonary disease, unspecified: Secondary | ICD-10-CM | POA: Insufficient documentation

## 2024-06-17 DIAGNOSIS — D62 Acute posthemorrhagic anemia: Secondary | ICD-10-CM | POA: Diagnosis not present

## 2024-06-17 DIAGNOSIS — S72401A Unspecified fracture of lower end of right femur, initial encounter for closed fracture: Secondary | ICD-10-CM | POA: Diagnosis not present

## 2024-06-17 DIAGNOSIS — F17213 Nicotine dependence, cigarettes, with withdrawal: Secondary | ICD-10-CM | POA: Diagnosis not present

## 2024-06-17 DIAGNOSIS — E876 Hypokalemia: Secondary | ICD-10-CM

## 2024-06-17 DIAGNOSIS — E871 Hypo-osmolality and hyponatremia: Secondary | ICD-10-CM | POA: Diagnosis not present

## 2024-06-17 LAB — COMPREHENSIVE METABOLIC PANEL WITH GFR
ALT: 12 U/L (ref 0–44)
AST: 22 U/L (ref 15–41)
Albumin: 2.6 g/dL — ABNORMAL LOW (ref 3.5–5.0)
Alkaline Phosphatase: 49 U/L (ref 38–126)
Anion gap: 8 (ref 5–15)
BUN: 8 mg/dL (ref 6–20)
CO2: 26 mmol/L (ref 22–32)
Calcium: 8.5 mg/dL — ABNORMAL LOW (ref 8.9–10.3)
Chloride: 99 mmol/L (ref 98–111)
Creatinine, Ser: 0.56 mg/dL (ref 0.44–1.00)
GFR, Estimated: >60 mL/min (ref >60)
Glucose, Bld: 93 mg/dL (ref 70–99)
Potassium: 3.8 mmol/L (ref 3.5–5.1)
Sodium: 133 mmol/L — ABNORMAL LOW (ref 135–145)
Total Bilirubin: 0.8 mg/dL (ref 0.0–1.2)
Total Protein: 5.8 g/dL — ABNORMAL LOW (ref 6.5–8.1)

## 2024-06-17 LAB — CBC WITH DIFFERENTIAL/PLATELET
Abs Immature Granulocytes: 0.03 10*3/uL (ref 0.00–0.07)
Basophils Absolute: 0 10*3/uL (ref 0.0–0.1)
Basophils Relative: 1 %
Eosinophils Absolute: 0.1 10*3/uL (ref 0.0–0.5)
Eosinophils Relative: 2 %
HCT: 25 % — ABNORMAL LOW (ref 36.0–46.0)
Hemoglobin: 8.3 g/dL — ABNORMAL LOW (ref 12.0–15.0)
Immature Granulocytes: 1 %
Lymphocytes Relative: 13 %
Lymphs Abs: 0.8 10*3/uL (ref 0.7–4.0)
MCH: 31.7 pg (ref 26.0–34.0)
MCHC: 33.2 g/dL (ref 30.0–36.0)
MCV: 95.4 fL (ref 80.0–100.0)
Monocytes Absolute: 0.8 10*3/uL (ref 0.1–1.0)
Monocytes Relative: 14 %
Neutro Abs: 3.9 10*3/uL (ref 1.7–7.7)
Neutrophils Relative %: 69 %
Platelets: 198 10*3/uL (ref 150–400)
RBC: 2.62 MIL/uL — ABNORMAL LOW (ref 3.87–5.11)
RDW: 17.2 % — ABNORMAL HIGH (ref 11.5–15.5)
WBC: 5.6 10*3/uL (ref 4.0–10.5)
nRBC: 0 % (ref 0.0–0.2)

## 2024-06-17 LAB — MAGNESIUM: Magnesium: 2.1 mg/dL (ref 1.7–2.4)

## 2024-06-17 MED ORDER — IPRATROPIUM-ALBUTEROL 0.5-2.5 (3) MG/3ML IN SOLN
3.0000 mL | Freq: Four times a day (QID) | RESPIRATORY_TRACT | Status: DC
  Administered 2024-06-18: 3 mL via RESPIRATORY_TRACT
  Filled 2024-06-17 (×2): qty 3

## 2024-06-17 MED ORDER — ALBUTEROL SULFATE (2.5 MG/3ML) 0.083% IN NEBU: 2.5000 mg | INHALATION_SOLUTION | RESPIRATORY_TRACT | Status: DC | PRN

## 2024-06-17 NOTE — Assessment & Plan Note (Addendum)
 Appropriate rise in hemoglobin status post 1 unit packed red blood cell transfusion on 6/24 Acute on chronic anemia likely secondary to acute blood loss from recent surgery No evidence of active bleeding Workup revealing iron deficiency, patient has since been placed on daily ferrous sulfate supplementation Monitoring hemoglobin and hematocrit with serial CBCs

## 2024-06-17 NOTE — Assessment & Plan Note (Signed)
 Likely multifactorial secondary to continued severe pain in the setting of postoperative anemia Heart rates do appear to have improved somewhat since yesterday with administration of intravenous fluids and 1 unit packed red blood cell transfusion No clinical evidence of infection but we will go ahead and obtain a chest x-ray to rule out pneumonia In the absence of chest pain, shortness of breath, hypoxia or cough with hemoptysis pulmonary embolism is quite unlikely and therefore will not pursue CT angiogram of the chest at this time unless patient clinically deteriorates TSH unremarkable

## 2024-06-17 NOTE — Assessment & Plan Note (Signed)
?   Counseling on cessation daily ?

## 2024-06-17 NOTE — Assessment & Plan Note (Signed)
Replacing with intravenous magnesium sulfate Monitoring magnesium levels with serial chemistries.

## 2024-06-17 NOTE — Assessment & Plan Note (Addendum)
 Status post right knee revision 2 distal femur replacement 06/14/2024 Management per orthopedic service Patient complaining of right lower extremity edema, I believe this is likely an expected development in the postoperative state and should be transient.  I do not believe that Lasix is currently necessary despite patient's request

## 2024-06-17 NOTE — Hospital Course (Signed)
 58 year old with history of tobacco use disorder and osteoporosis with a recent right periprosthetic distal femur fracture status post right revision total knee arthroplasty on 6/25. On postop day 1, patient found to have elevated heart rate with HR persistently in the 110s to 120s. Patient given pain and anxiety medications without significant improvement in heart rate. EKG was done and TRH was consulted to assist with management of her tachycardia.

## 2024-06-17 NOTE — Assessment & Plan Note (Signed)
Vitamin D supplementation 

## 2024-06-17 NOTE — Assessment & Plan Note (Signed)
 Improved somewhat with intravenous fluids Likely secondary to hypovolemia/poor oral intake Continue to trend sodium levels with serial chemistries, will expand workup if worsening

## 2024-06-17 NOTE — Assessment & Plan Note (Signed)
 Replaced

## 2024-06-17 NOTE — Assessment & Plan Note (Addendum)
 Notably improving  Sinus tachycardia was likely multifactorial secondary to continued severe pain in the setting of postoperative anemia Patient is status post administration of intravenous fluids and 1 unit packed red blood cell transfusion Chest x-ray obtained 6/27 revealed no evidence of pneumonia In the absence of chest pain, shortness of breath, hypoxia or cough with hemoptysis pulmonary embolism is quite unlikely and therefore will not pursue CT angiogram of the chest at this time unless patient clinically deteriorates TSH unremarkable

## 2024-06-17 NOTE — Progress Notes (Addendum)
 CONSULT PROGRESS NOTE   Madison Malone  FMW:[id number] DOB: April 08, 1966 DOA: 06/10/2024 PCP: [name] ORN, MD   Date of Service: the patient was seen and examined on 06/17/2024  Brief Narrative:  58 year old with history of tobacco use disorder and osteoporosis with a recent right periprosthetic distal femur fracture status post right revision total knee arthroplasty on 6/25. On postop day 1, patient found to have elevated heart rate with HR persistently in the 110s to 120s. Patient given pain and anxiety medications without significant improvement in heart rate. EKG was done and TRH was consulted to assist with management of her tachycardia.   Assessment & Plan Closed fracture of right distal femur [facility]) Status post right knee revision 2 distal femur replacement 06/14/2024 Management per orthopedic service Patient complaining of right lower extremity edema, I believe this is likely an expected development in the postoperative state and should be transient.  I do not believe that Lasix is currently necessary despite patient's request Sinus tachycardia Notably improving  Sinus tachycardia was likely multifactorial secondary to continued severe pain in the setting of postoperative anemia Patient is status post administration of intravenous fluids and 1 unit packed red blood cell transfusion Chest x-ray obtained 6/27 revealed no evidence of pneumonia In the absence of chest pain, shortness of breath, hypoxia or cough with hemoptysis pulmonary embolism is quite unlikely and therefore will not pursue CT angiogram of the chest at this time unless patient clinically deteriorates TSH unremarkable COPD (chronic obstructive pulmonary disease) (HCC) Patient has never been formally diagnosed with COPD but in the setting of longstanding smoking and characteristic physical exam signs this diagnosis is highly likely.   Wheezing seems worsened since yesterday, will make bronchodilator therapy  scheduled  If symptoms continue to persist/worsening in the next 24 hours we will consider initiation of a short burst of systemic steroids.  Acute postoperative anemia due to expected blood loss Appropriate rise in hemoglobin status post 1 unit packed red blood cell transfusion on 6/24 Acute on chronic anemia likely secondary to acute blood loss from recent surgery No evidence of active bleeding Workup revealing iron deficiency, patient has since been placed on daily ferrous sulfate supplementation Monitoring hemoglobin and hematocrit with serial CBCs Hyponatremia Continued improvement in hyponatremia Likely secondary to hypovolemia/poor oral intake Continue to trend sodium levels with serial chemistries, will expand workup if worsening Vitamin D  deficiency Vitamin D  supplementation Nicotine dependence Counseling on cessation daily, patient seems interested in quitting Hypomagnesemia Replaced  Hypokalemia Replaced      Subjective:  Patient states that her knee pain seems to be improving.  Patient reports ambulating a great distance today.  Patient additionally complaining of worsening wheezing with some shortness of breath.  Physical Exam:  Vitals:   06/16/24 1740 06/16/24 2126 06/17/24 0142 06/17/24 0524  BP: 121/72 (!) 105/93 118/77 133/74  Pulse: (!) 104 (!) 103 93 86  Resp: 16 18 18 18   Temp: 98.3 F (36.8 C) 98.3 F (36.8 C) 98 F (36.7 C) 97.8 F (36.6 C)  TempSrc:  Oral Oral Oral  SpO2: 99% 94% 94% 97%  Weight:      Height:        Constitutional: Awake alert and oriented x3, no associated distress.   Skin: no rashes, no lesions, good skin turgor noted. Eyes: Pupils are equally reactive to light.  No evidence of scleral icterus or conjunctival pallor.  ENMT: Moist mucous membranes noted.  Posterior pharynx clear of any exudate or lesions.   Respiratory: Worsening  expiratory wheezing heard particularly in the anterior superior lung fields.  Diminished breath  sounds with prolonged expiratory phase.  Normal respiratory effort. No accessory muscle use.  Cardiovascular: Regular rate and rhythm, no murmurs / rubs / gallops. No extremity edema. 2+ pedal pulses. No carotid bruits.  Abdomen: Abdomen is soft and nontender.  No evidence of intra-abdominal masses.  Positive bowel sounds noted in all quadrants.   Musculoskeletal: Pain with any manipulation of the right lower extremity.  Right knee immobilizer in place.  Data Reviewed:  I have personally reviewed and interpreted labs, imaging.  Significant findings are   CBC: Recent Labs  Lab 06/13/24 0612 06/13/24 1941 06/14/24 0718 06/15/24 0633 06/16/24 0127 06/16/24 0825 06/17/24 0348  WBC 4.8  --  4.6 6.1 7.0  --  5.6  NEUTROABS  --   --   --   --   --   --  3.9  HGB 6.9*   < > 8.4* 7.9* 9.2* 9.2* 8.3*  HCT 19.8*   < > 25.5* 23.8* 27.8* 27.2* 25.0*  MCV 100.5*  --  99.6 100.8* 102.2*  --  95.4  PLT 188  --  196 192 230  --  198   < > = values in this interval not displayed.   Basic Metabolic Panel: Recent Labs  Lab 06/11/24 0632 06/13/24 0612 06/14/24 0718 06/16/24 0127 06/17/24 0348  NA 125* 125* 129* 129* 133*  K 4.1 3.8 3.7 3.4* 3.8  CL 96* 94* 95* 92* 99  CO2 24 22 23 22 26   GLUCOSE 93 90 97 114* 93  BUN 9 10 14 6 8   CREATININE 0.57 0.67 0.60 0.65 0.56  CALCIUM 8.2* 8.5* 8.8* 8.4* 8.5*  MG  --   --   --  1.6* 2.1  PHOS  --   --   --  2.9  --    GFR: Estimated Creatinine Clearance: 61.2 mL/min (by C-G formula based on SCr of 0.56 mg/dL). Liver Function Tests: Recent Labs  Lab 06/11/24 0632 06/17/24 0348  AST 29 22  ALT 16 12  ALKPHOS 48 49  BILITOT 0.5 0.8  PROT 5.9* 5.8*  ALBUMIN  3.2* 2.6*    EKG:  Personally reviewed.  Rhythm is sinus tachycardia.  Code Status:  Full code.     Time spent:  45 minutes  Author:  Zachary JINNY Ba MD  06/17/2024 9:12 AM

## 2024-06-17 NOTE — Assessment & Plan Note (Addendum)
 Continued improvement in hyponatremia Likely secondary to hypovolemia/poor oral intake Continue to trend sodium levels with serial chemistries, will expand workup if worsening

## 2024-06-17 NOTE — Progress Notes (Signed)
 VASCULAR LAB    Right lower extremity venous duplex has been performed.  See CV proc for preliminary results.   Timira Bieda, RVT 06/17/2024, 2:27 PM

## 2024-06-17 NOTE — Assessment & Plan Note (Signed)
·   Replacing with potassium chloride °· Evaluating for concurrent hypomagnesemia  °· Monitoring potassium levels with serial chemistries. ° °

## 2024-06-17 NOTE — Plan of Care (Signed)
  Problem: Education: Goal: Knowledge of General Education information will improve Description: Including pain rating scale, medication(s)/side effects and non-pharmacologic comfort measures Outcome: Progressing   Problem: Health Behavior/Discharge Planning: Goal: Ability to manage health-related needs will improve Outcome: Progressing   Problem: Clinical Measurements: Goal: Ability to maintain clinical measurements within normal limits will improve Outcome: Progressing Goal: Will remain free from infection Outcome: Progressing Goal: Diagnostic test results will improve Outcome: Progressing Goal: Respiratory complications will improve Outcome: Progressing Goal: Cardiovascular complication will be avoided Outcome: Progressing   Problem: Activity: Goal: Risk for activity intolerance will decrease Outcome: Progressing   Problem: Coping: Goal: Level of anxiety will decrease Outcome: Progressing   Problem: Elimination: Goal: Will not experience complications related to bowel motility Outcome: Progressing   Problem: Pain Managment: Goal: General experience of comfort will improve and/or be controlled Outcome: Progressing   Problem: Safety: Goal: Ability to remain free from injury will improve Outcome: Progressing   Problem: Skin Integrity: Goal: Risk for impaired skin integrity will decrease Outcome: Progressing

## 2024-06-17 NOTE — Progress Notes (Signed)
     Subjective:  Patient reports pain as moderate. She is still requiring IV medications. She knows she cannot be on these when she discharges from the hospital. She also states she is having a lot of swelling and has previously taken lasix to help reduce fluid. She is interested in using lasix again to help with her swelling in her leg.  No concerns.  Objective:   VITALS:   Vitals:   06/16/24 1740 06/16/24 2126 06/17/24 0142 06/17/24 0524  BP: 121/72 (!) 105/93 118/77 133/74  Pulse: (!) 104 (!) 103 93 86  Resp: 16 18 18 18   Temp: 98.3 F (36.8 C) 98.3 F (36.8 C) 98 F (36.7 C) 97.8 F (36.6 C)  TempSrc:  Oral Oral Oral  SpO2: 99% 94% 94% 97%  Weight:      Height:        Sensation intact distally Intact pulses distally Dorsiflexion/Plantar flexion intact Incision: dressing C/D/I Compartment soft Wound vac holding suction . No drainage noted in canister.   Lab Results  Component Value Date   WBC 5.6 06/17/2024   HGB 8.3 (L) 06/17/2024   HCT 25.0 (L) 06/17/2024   MCV 95.4 06/17/2024   PLT 198 06/17/2024   BMET    Component Value Date/Time   NA 133 (L) 06/17/2024 0348   K 3.8 06/17/2024 0348   CL 99 06/17/2024 0348   CO2 26 06/17/2024 0348   GLUCOSE 93 06/17/2024 0348   BUN 8 06/17/2024 0348   CREATININE 0.56 06/17/2024 0348   CALCIUM 8.5 (L) 06/17/2024 0348   GFRNONAA >60 06/17/2024 0348    Xray: Distal femur replacement components in good position no adverse features  Assessment/Plan: 3 Days Post-Op   Principal Problem:   Closed fracture of right distal femur (HCC) Active Problems:   Osteoporosis   Vitamin D  deficiency   Nicotine dependence   Sinus tachycardia   Acute postoperative anemia due to expected blood loss   Macrocytic anemia   Hyponatremia   Wheezing   Hypomagnesemia   Hypokalemia   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   Post op  recs: WB: WBAT-knee immobilizer for mobilization postop for soft tissue protection given very thin skin. Abx: ancef  and cefadroxil  for extended prophylaxis postop IntraOp cultures no growth to date Imaging: PACU xrays DVT prophylaxis: Aspirin  81mg  BID x4 weeks Follow up: 2 weeks after surgery for a wound check with Dr. Edna at [facility].  Address: [address], [place], [place] [address]  Office Phone: [phone]    Aleck LOISE Stalling 06/17/2024, 11:04 AM   Contact information:  After hours and holidays please check [email or web address] for group call information for [facility]

## 2024-06-17 NOTE — Assessment & Plan Note (Signed)
 Diminished breath sounds on exam with prolonged expiratory phase and intermittent expiratory wheezing in the anterior superior fields Likely undiagnosed mild COPD in the absence of exacerbation As needed bronchodilator therapy ordered

## 2024-06-17 NOTE — Assessment & Plan Note (Signed)
 Status post right knee revision 2 distal femur replacement 06/14/2024 Management per orthopedic service

## 2024-06-17 NOTE — Progress Notes (Signed)
 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  Past medical history of multiple knee and hip surgeries, Anxiety, Arthritis, Chronic low back pain, GERD (gastroesophageal reflux disease), and Osteoporosis.    PT Comments  Pt determined to improve mobility today and ambulated around entire unit.  Pt also practiced performing sit to stands with improved technique for safety and pain control.  Pt assisted back to bed per request (better positioning and elevating lower legs).  Pt anticipates return home at d/c with family/friend support and has goal for performing steps to enter home prior to d/c.     If plan is discharge home, recommend the following: A little help with walking and/or transfers;A little help with bathing/dressing/bathroom;Assistance with cooking/housework;Assist for transportation;Help with stairs or ramp for entrance   Can travel by private vehicle        Equipment Recommendations  BSC/3in1 (however pt plans to borrow [facility] from family)    Recommendations for Other Services       Precautions / Restrictions Precautions Precautions: Fall Precaution/Restrictions Comments: per orders: Do not bend right knee more than 45 degrees for 1-2 weeks. Utilize knee immobilizer when resting and WB. Required Braces or Orthoses: Knee Immobilizer - Right Knee Immobilizer - Right: On at all times Restrictions RLE Weight Bearing Per Provider Order: Weight bearing as tolerated     Mobility  Bed Mobility Overal bed mobility: Needs  Assistance Bed Mobility: Sit to Supine       Sit to supine: Min assist   General bed mobility comments: OOB with NT to [facility], assist for R LE onto bed due to pain    Transfers Overall transfer level: Needs assistance Equipment used: Rolling walker (2 wheels) Transfers: Sit to/from Stand Sit to Stand: Contact guard assist, Supervision           General transfer comment: cues for hand placement and safety; performed x4 from recliner with cues for positioning and pain control and improved to supervision    Ambulation/Gait Ambulation/Gait assistance: Contact guard assist Gait Distance (Feet): 400 Feet Assistive device: Rolling walker (2 wheels) Gait Pattern/deviations: Step-to pattern, Decreased stance time - right, Antalgic       General Gait Details: verbal cues for sequence, RW positioning, neutral alignment (R toe in observed); denies any symptoms   Stairs             Wheelchair Mobility     Tilt Bed    Modified Rankin (Stroke Patients Only)       Balance                                            Communication Communication Communication: No apparent difficulties  Cognition Arousal: Alert Behavior During Therapy: WFL for tasks assessed/performed   PT - Cognitive impairments: Problem solving, Safety/Judgement                       PT - Cognition Comments: cues for safety and technique; agreeable to grippy socks  today Following commands: Intact      Cueing    Exercises      General Comments        Pertinent Vitals/Pain Pain Assessment Pain Assessment: 0-10 Pain Score: 8  Pain Location: right knee Pain Descriptors / Indicators: Sore, Aching, Grimacing, Guarding Pain Intervention(s): Monitored during session, Premedicated before session, Repositioned, Patient requesting pain meds-RN notified    Home Living Family/patient expects to be discharged to:: Private residence Living Arrangements: Alone Available  Help at Discharge: Family;Friend(s);Available 24 hours/day Type of Home: House Home Access: Stairs to enter Entrance Stairs-Rails: Right Entrance Stairs-Number of Steps: 5   Home Layout: One level Home Equipment: Agricultural consultant (2 wheels);Cane - single point Additional Comments: plans to borrow a BSC    Prior Function            PT Goals (current goals can now be found in the care plan section) Progress towards PT goals: Progressing toward goals    Frequency    7X/week      PT Plan      Co-evaluation              AM-PAC PT 6 Clicks Mobility   Outcome Measure  Help needed turning from your back to your side while in a flat bed without using bedrails?: A Little Help needed moving from lying on your back to sitting on the side of a flat bed without using bedrails?: A Little Help needed moving to and from a bed to a chair (including a wheelchair)?: A Little Help needed standing up from a chair using your arms (e.g., wheelchair or bedside chair)?: A Little Help needed to walk in hospital room?: A Little Help needed climbing 3-5 steps with a railing? : A Little 6 Click Score: 18    End of Session Equipment Utilized During Treatment: Gait belt;Right knee immobilizer Activity Tolerance: Patient tolerated treatment well Patient left: in bed;with call bell/phone within reach;with bed alarm set Nurse Communication: Mobility status;Patient requests pain meds PT Visit Diagnosis: Difficulty in walking, not elsewhere classified (R26.2)     Time: 1109-1140 PT Time Calculation (min) (ACUTE ONLY): 31 min  Charges:    $Gait Training: 23-37 mins PT General Charges $$ ACUTE PT VISIT: 1 Visit                     Tari KLEIN, DPT Physical Therapist Acute Rehabilitation Services Office: [phone]    Tari CROME Payson 06/17/2024, 5:24 PM

## 2024-06-17 NOTE — Evaluation (Signed)
 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  Past medical history of multiple knee and hip surgeries, Anxiety, Arthritis, Chronic low back pain, GERD (gastroesophageal reflux disease), and Osteoporosis.  OT ordered on 06/16/24 due to post-op decreased Bil shoulder ROM and pain with ROM.     Clinical Impressions Patient is currently requiring as high as maximum assistance with basic ADLs due to her post-op sudden onset inability to raise Ues at the shoulder with noted Bil shoulder pain described in detail below, intact sensation and severe shoulder and bicep weakness with intact tricep and grip strength WFL. Current level of function is below patient's typical baseline.  PT also with RT knee deficits and deferring to PT with this to allow OT to focus on pt's shoulders.   During this evaluation, patient was limited by Bilateral shoulder pain to entire [facility] joint capsule, acute and severe BUEs weakness as above, impaired activity tolerance, and RLE deficits and pain, all of which has the potential to impact patient's and/or caregivers' safety and independence during functional mobility, as well as performance for ADLs.    Patient reports that she will be starting [facility] PT and agreeable to [facility] OT to continue progressing her shoulder rehab and finding modified ways to compensate in order to perform necessary ADLs.  Patient demonstrates good rehab potential, and should benefit from continued skilled occupational therapy services while in acute care to  maximize safety, independence and quality of life at home.    ?      If plan is discharge home, recommend the following:   A little help with walking and/or transfers;A lot of help with bathing/dressing/bathroom;Assistance with cooking/housework;Assist for transportation     Functional Status Assessment   Patient has had a recent decline in their functional status and demonstrates the ability to make significant improvements in function in a reasonable and predictable amount of time.     Equipment Recommendations   Other (comment) (TBD)     Recommendations for Other Services         Precautions/Restrictions   Precautions Precautions: Fall Precaution/Restrictions Comments: per orders: Do not bend right knee more than 45 degrees for 1-2 weeks. Utilize knee immobilizer when resting and WB. Required Braces or Orthoses: Knee Immobilizer - Right Knee Immobilizer - Right: On at all times Restrictions Weight Bearing Restrictions Per Provider Order: Yes RLE Weight Bearing Per Provider Order: Weight bearing as tolerated     Mobility Bed Mobility               General bed mobility comments: Defer to PT for now.    Transfers                          Balance                                           ADL either performed or assessed with clinical judgement   ADL Overall ADL's : Needs assistance/impaired  General ADL Comments: BEd level Evaluation today to address B shoulder issues per order for OT. Pt working with PT on mobility. All ADLs are affected by pt's RT knee restrictions/pain and with inability to raise UEs off bed or hold up, as well as severe B UE pain at shoulders.     Vision   Vision Assessment?: No apparent visual deficits     Perception         Praxis         Pertinent Vitals/Pain Pain Assessment Pain Assessment: 0-10 Pain Score: 8  (Up to 10/10  shoulders with ROM/testing) Pain Location: LT knee and B shoulders Pain Descriptors / Indicators: Sore, Aching, Grimacing, Guarding, Moaning Pain Intervention(s): Patient requesting pain meds-RN notified, RN gave pain meds during session, Limited activity within patient's tolerance, Monitored during session     Extremity/Trunk Assessment Upper Extremity Assessment Upper Extremity Assessment: Left hand dominant;RUE deficits/detail;LUE deficits/detail RUE Deficits / Details: Pain at rest 7-8/10 with increased pain to 8-9/10 with PROM of shoulder to 90 degrees. Pain subsides after ~110 degrees. Negative Hawkins-Kennedy for impingement. Increased pain with ER in low position. MMT: Shoulder 2-/5, bicep: 2/5, Tricep: WNL, Grip: WNL.  Point tender pain to posterior and anterior capsules and supraspinatus insertion site. RUE: Shoulder pain at rest;Shoulder pain with ROM RUE Sensation: WNL RUE Coordination: WNL LUE Deficits / Details: Pain at rest 8/10 with increased pain to 9-10/10 with PROM of shoulder to 90 degrees. Pain subsides after ~110 degrees then increases again closer to 150. Positive Hawkins-Kennedy for impingement. Increased pain with ER in low position. MMT: Shoulder 2-/5, bicep: 2/5, Tricep: WNL, Grip: WNL. Point tender pain to anterior>posterior capsule and supraspinatus insertion but less pain here in comparison to RT. LUE Sensation: WNL LUE Coordination: WNL           Communication Communication Communication: No apparent difficulties   Cognition Arousal: Alert Behavior During Therapy: WFL for tasks assessed/performed Cognition: No apparent impairments             OT - Cognition Comments: Pt aware that she is easily distracted but responds well to redirection.                 Following commands: Intact       Cueing  General Comments   Cueing Techniques: Verbal cues;Tactile cues;Visual cues      Exercises Other Exercises Other Exercises: Pt noted to be  rounded at back with pt reported OP and scoliosis. Scapular glide is compromised due to this but appears/felt WFL. Upper trap and Levator tightness with pain on palpation. Pt performed shoulder shurgs, scapular retractions, and shoulder rolls for capital D. Neck/head retractions, runner stretch, upper trap then levator stretches. Handouts provided. Other Exercises: Pt given isometric exercise written HEP with instructions for sub-max isometrics, discontinuation of any exercise that causes increased pain, and ease in performing in bed.. Pt able to demonstrate correct isometric for FF of shoulder but unable to continue due to staff member in room for imaging.   Shoulder Instructions      Home Living Family/patient expects to be discharged to:: Private residence Living Arrangements: Alone Available Help at Discharge: Family;Friend(s);Available 24 hours/day Type of Home: House Home Access: Stairs to enter [employer] of Steps: 5 Entrance Stairs-Rails: Right Home Layout: One level               Home Equipment: Agricultural consultant (2 wheels);Cane - single point   Additional Comments: plans to borrow a [facility]  Prior Functioning/Environment Prior Level of Function : Independent/Modified Independent                    OT Problem List:     OT Treatment/Interventions: Therapeutic exercise;Self-care/ADL training;DME and/or AE instruction;Manual therapy;Patient/family education;Therapeutic activities      OT Goals(Current goals can be found in the care plan section)   Acute Rehab OT Goals Patient Stated Goal: Have arms move normally again. OT Goal Formulation: With patient Time For Goal Achievement: 07/01/24 Potential to Achieve Goals: Good ADL Goals Pt Will Perform Grooming: bed level;sitting;with min assist (MNin As for hair care. Setup only for hands/face.oral care) Pt Will Perform Upper Body Bathing: with min assist;sitting;bed level;with adaptive equipment Pt  Will Perform Upper Body Dressing: with contact guard assist;sitting;bed level;with adaptive equipment Pt/caregiver will Perform Home Exercise Program: With written HEP provided;Increased ROM;Increased strength;Both right and left upper extremity;Independently (Written HEP in room with pt. Progress program as able.)   OT Frequency:  Min 3X/week    Co-evaluation              AM-PAC OT 6 Clicks Daily Activity     Outcome Measure Help from another person eating meals?: A Little Help from another person taking care of personal grooming?: A Lot Help from another person toileting, which includes using toliet, bedpan, or urinal?: A Lot Help from another person bathing (including washing, rinsing, drying)?: A Lot Help from another person to put on and taking off regular upper body clothing?: A Lot Help from another person to put on and taking off regular lower body clothing?: A Lot 6 Click Score: 13   End of Session Nurse Communication: Patient requests pain meds  Activity Tolerance: Patient limited by pain Patient left: in bed;with call bell/phone within reach;with nursing/sitter in room  OT Visit Diagnosis: Pain Pain - Right/Left:  (Bil) Pain - part of body: Shoulder (and RT knee)                Time: 1325-1409 OT Time Calculation (min): 44 min Charges:  OT General Charges $OT Visit: 1 Visit OT Evaluation $OT Eval High Complexity: 1 High OT Treatments $Therapeutic Exercise: 23-37 mins  Delon, OT Acute Rehab Services Office: [phone] 06/17/2024   Delon Falter 06/17/2024, 3:21 PM

## 2024-06-17 NOTE — Assessment & Plan Note (Addendum)
 Patient has never been formally diagnosed with COPD but in the setting of longstanding smoking and characteristic physical exam signs this diagnosis is highly likely.   Wheezing seems worsened since yesterday, will make bronchodilator therapy scheduled  If symptoms continue to persist/worsening in the next 24 hours we will consider initiation of a short burst of systemic steroids.

## 2024-06-17 NOTE — Assessment & Plan Note (Signed)
 Appropriate rise in hemoglobin status post 1 unit packed red blood cell transfusion on 6/24 Acute on chronic anemia likely secondary to acute blood loss from recent surgery No evidence of active bleeding Workup revealing iron deficiency, will place patient on daily ferrous sulfate supplementation Monitoring hemoglobin and hematocrit with serial CBCs

## 2024-06-17 NOTE — Assessment & Plan Note (Addendum)
 Counseling on cessation daily, patient seems interested in quitting

## 2024-06-17 NOTE — Plan of Care (Signed)
  Problem: Education: Goal: Knowledge of General Education information will improve Description: Including pain rating scale, medication(s)/side effects and non-pharmacologic comfort measures Outcome: Adequate for Discharge   Problem: Health Behavior/Discharge Planning: Goal: Ability to manage health-related needs will improve Outcome: Progressing   Problem: Clinical Measurements: Goal: Ability to maintain clinical measurements within normal limits will improve Outcome: Progressing Goal: Will remain free from infection Outcome: Progressing Goal: Diagnostic test results will improve Outcome: Progressing Goal: Respiratory complications will improve Outcome: Progressing Goal: Cardiovascular complication will be avoided Outcome: Progressing   Problem: Activity: Goal: Risk for activity intolerance will decrease Outcome: Progressing   Problem: Nutrition: Goal: Adequate nutrition will be maintained Outcome: Completed/Met   Problem: Coping: Goal: Level of anxiety will decrease Outcome: Progressing   Problem: Elimination: Goal: Will not experience complications related to bowel motility Outcome: Progressing Goal: Will not experience complications related to urinary retention Outcome: Completed/Met   Problem: Pain Managment: Goal: General experience of comfort will improve and/or be controlled Outcome: Progressing   Problem: Safety: Goal: Ability to remain free from injury will improve Outcome: Progressing   Problem: Skin Integrity: Goal: Risk for impaired skin integrity will decrease Outcome: Adequate for Discharge

## 2024-06-18 DIAGNOSIS — E871 Hypo-osmolality and hyponatremia: Secondary | ICD-10-CM | POA: Diagnosis not present

## 2024-06-18 DIAGNOSIS — F17213 Nicotine dependence, cigarettes, with withdrawal: Secondary | ICD-10-CM | POA: Diagnosis not present

## 2024-06-18 DIAGNOSIS — S72401A Unspecified fracture of lower end of right femur, initial encounter for closed fracture: Secondary | ICD-10-CM | POA: Diagnosis not present

## 2024-06-18 DIAGNOSIS — D62 Acute posthemorrhagic anemia: Secondary | ICD-10-CM | POA: Diagnosis not present

## 2024-06-18 MED ORDER — IPRATROPIUM-ALBUTEROL 0.5-2.5 (3) MG/3ML IN SOLN
3.0000 mL | Freq: Two times a day (BID) | RESPIRATORY_TRACT | Status: DC
  Administered 2024-06-18 – 2024-06-19 (×2): 3 mL via RESPIRATORY_TRACT
  Filled 2024-06-18 (×2): qty 3

## 2024-06-18 NOTE — Plan of Care (Signed)
  Problem: Health Behavior/Discharge Planning: Goal: Ability to manage health-related needs will improve Outcome: Progressing   Problem: Clinical Measurements: Goal: Ability to maintain clinical measurements within normal limits will improve Outcome: Progressing Goal: Will remain free from infection Outcome: Progressing Goal: Diagnostic test results will improve Outcome: Progressing Goal: Respiratory complications will improve Outcome: Progressing Goal: Cardiovascular complication will be avoided Outcome: Progressing   Problem: Activity: Goal: Risk for activity intolerance will decrease Outcome: Progressing   Problem: Coping: Goal: Level of anxiety will decrease Outcome: Progressing   Problem: Elimination: Goal: Will not experience complications related to bowel motility Outcome: Progressing   Problem: Pain Managment: Goal: General experience of comfort will improve and/or be controlled Outcome: Progressing   Problem: Safety: Goal: Ability to remain free from injury will improve Outcome: Progressing   Problem: Skin Integrity: Goal: Risk for impaired skin integrity will decrease Outcome: Progressing

## 2024-06-18 NOTE — Progress Notes (Signed)
 Physical Therapy Treatment Patient Details Name: Madison Malone MRN: [id number] DOB: 1966-05-08 Today's Date: 06/18/2024   History of Present Illness Pt is a 58 year old female admitted after a fall resulting in comminuted, impacted, and displaced distal femoral metadiaphyseal  fracture extends into the interface with the femoral component of the right knee arthroplasty, and R distal thigh intramuscular hematoma and currently s/p Status post right knee revision to distal femur replacement 06/14/2024.  Past medical history of multiple knee and hip surgeries, Anxiety, Arthritis, Chronic low back pain, GERD (gastroesophageal reflux disease), and Osteoporosis.    PT Comments  Happy Madison Malone!!  Pt ambulated in hallway and performed stairs.  Pt with improved safety and technique with mobilizing and anticipates d/c tomorrow.  Pt able to state her KI and decreased knee flexion precautions.  Pt anticipates f/u with HHPT.  Pt had no further questions and did not feel she needed morning PT session prior to d/c.  Pt ready for d/c home from PT standpoint.     If plan is discharge home, recommend the following: A little help with walking and/or transfers;A little help with bathing/dressing/bathroom;Assistance with cooking/housework;Assist for transportation;Help with stairs or ramp for entrance   Can travel by private vehicle        Equipment Recommendations  BSC/3in1 (however pt plans to borrow Madison Malone from family)    Recommendations for Other Services       Precautions / Restrictions Precautions Precautions: Fall Precaution/Restrictions Comments: per orders: Do not bend right knee more than 45 degrees for 1-2 weeks. Utilize knee immobilizer when resting and WB. Required Braces or Orthoses: Knee Immobilizer - Right Knee Immobilizer - Right: On at all times Restrictions RLE Weight Bearing Per Provider Order: Weight bearing as tolerated     Mobility  Bed Mobility Overal bed mobility: Needs  Assistance Bed Mobility: Supine to Sit, Sit to Supine     Supine to sit: Supervision, HOB elevated Sit to supine: Min assist, HOB elevated   General bed mobility comments: pt utilizes gait belt to self assist; difficulty with returning R LE to bed requested assist due to pain    Transfers Overall transfer level: Needs assistance Equipment used: Rolling walker (2 wheels) Transfers: Sit to/from Stand Sit to Stand: Supervision           General transfer comment: recalls proper hand placement    Ambulation/Gait Ambulation/Gait assistance: Contact guard assist, Supervision Gait Distance (Feet): 200 Feet Assistive device: Rolling walker (2 wheels) Gait Pattern/deviations: Step-to pattern, Decreased stance time - right, Antalgic       General Gait Details: verbal cues for sequence, RW positioning, neutral alignment (R toe in observed); denies any symptoms   Stairs Stairs: Yes Stairs assistance: Contact guard assist Stair Management: Step to pattern, Forwards, With walker Number of Stairs: 3 General stair comments: pt prefers to perform forwards with RW; able to demonstrate safe technique with no assist required; pt performed twice   Wheelchair Mobility     Tilt Bed    Modified Rankin (Stroke Patients Only)       Balance                                            Communication Communication Communication: No apparent difficulties  Cognition Arousal: Alert Behavior During Therapy: WFL for tasks assessed/performed   PT - Cognitive impairments: No apparent impairments  PT - Cognition Comments: improved safety awareness; able to recall precautions Following commands: Intact      Cueing    Exercises      General Comments        Pertinent Vitals/Pain Pain Assessment Pain Assessment: 0-10 Pain Score: 8  Pain Location: right knee Pain Descriptors / Indicators: Sore, Aching, Grimacing, Guarding Pain  Intervention(s): Monitored during session, Repositioned, Premedicated before session    Home Living                          Prior Function            PT Goals (current goals can now be found in the care plan section) Progress towards PT goals: Progressing toward goals    Frequency    7X/week      PT Plan      Co-evaluation              AM-PAC PT 6 Clicks Mobility   Outcome Measure  Help needed turning from your back to your side while in a flat bed without using bedrails?: A Little Help needed moving from lying on your back to sitting on the side of a flat bed without using bedrails?: A Little Help needed moving to and from a bed to a chair (including a wheelchair)?: A Little Help needed standing up from a chair using your arms (e.g., wheelchair or bedside chair)?: A Little Help needed to walk in hospital room?: A Little Help needed climbing 3-5 steps with a railing? : A Little 6 Click Score: 18    End of Session Equipment Utilized During Treatment: Gait belt;Right knee immobilizer Activity Tolerance: Patient tolerated treatment well Patient left: in bed;with call bell/phone within reach;with bed alarm set   PT Visit Diagnosis: Difficulty in walking, not elsewhere classified (R26.2)     Time: 8940-8866 PT Time Calculation (min) (ACUTE ONLY): 34 min  Charges:    $Gait Training: 23-37 mins PT General Charges $$ ACUTE PT VISIT: 1 Visit                     Madison Malone, DPT Physical Therapist Acute Rehabilitation Services Office: [phone]   Madison Malone 06/18/2024, 1:02 PM

## 2024-06-18 NOTE — Progress Notes (Signed)
 Subjective: Patient reports pain as moderate.  Tolerating diet.  Urinating.   No CP, SOB.  Has mobilized OOB with PT/OT.   Somewhat limited recently by BUE pain and decreased ROM in shoulder. Denies previous issues with the shoulders before surgery. Denies n/t in arms. Xrays normal. Doppler normal.   Objective:   VITALS:   Vitals:   06/17/24 1428 06/17/24 2236 06/18/24 0522 06/18/24 0750  BP: 122/75 114/78 131/78   Pulse: 97 96 97   Resp: 17 18 18    Temp: 98 F (36.7 C) 98.1 F (36.7 C) (!) 97.3 F (36.3 C)   TempSrc:  Oral Oral   SpO2: 98% 96% 96% 98%  Weight:      Height:          Latest Ref Rng & Units 06/17/2024    3:48 AM 06/16/2024    8:25 AM 06/16/2024    1:27 AM  CBC  WBC 4.0 - 10.5 K/uL 5.6   7.0   Hemoglobin 12.0 - 15.0 g/dL 8.3  9.2  9.2   Hematocrit 36.0 - 46.0 % 25.0  27.2  27.8   Platelets 150 - 400 K/uL 198   230       Latest Ref Rng & Units 06/17/2024    3:48 AM 06/16/2024    1:27 AM 06/14/2024    7:18 AM  BMP  Glucose 70 - 99 mg/dL 93  885  97   BUN 6 - 20 mg/dL 8  6  14    Creatinine 0.44 - 1.00 mg/dL 9.43  9.34  9.39   Sodium 135 - 145 mmol/L 133  129  129   Potassium 3.5 - 5.1 mmol/L 3.8  3.4  3.7   Chloride 98 - 111 mmol/L 99  92  95   CO2 22 - 32 mmol/L 26  22  23    Calcium 8.9 - 10.3 mg/dL 8.5  8.4  8.8    Intake/Output      06/28 0701 06/29 0700 06/29 0701 06/30 0700   P.O. 840    IV Piggyback 200    Total Intake(mL/kg) 1040 (20.8)    Urine (mL/kg/hr) 0 (0)    Drains 0    Stool 0    Total Output 0    Net +1040         Urine Occurrence 3 x    Stool Occurrence 0 x       Physical Exam: General: NAD.  Sitting up in bed, calm, comfortable, talkative Resp: No increased wob Cardio: regular rate and rhythm ABD soft Neurologically intact MSK Neurovascularly intact Sensation intact distally Intact pulses distally Dorsiflexion/Plantar flexion intact Incision: dressing C/D/I Wound vac on and functioning with 0 cc in canister KI  in place Mild edema and erythema to right foot/toes  Assessment: 4 Days Post-Op  S/P Procedure(s) (LRB): RIGHT KNEE REVISION TO DISTAL FEMUR REPLACEMENT (Right) by Dr. Edna on 06/14/24  Principal Problem:   Closed fracture of right distal femur (HCC) Active Problems:   Osteoporosis   Vitamin D  deficiency   Nicotine dependence   Sinus tachycardia   Acute postoperative anemia due to expected blood loss   Macrocytic anemia   Hyponatremia   COPD (chronic obstructive pulmonary disease) (HCC)   Hypomagnesemia   Hypokalemia    Plan: NGTD on cultures   Advance diet Up with therapy Incentive Spirometry Elevate and Apply ice  Weightbearing: WBAT RLE in KI Insicional and dressing care: Dressings left intact until follow-up and Reinforce dressings  as needed Orthopedic device(s): Wound Cjr:mphyu knee and KI for mobilization postop to protect thin skin Showering: Keep dressing dry VTE prophylaxis: Aspirin  81mg  BID x 30 days postop, SCDs, ambulation Pain control: PRN Follow - up plan: 2 weeks Contact information:  Evalene Chancy MD, Gerard Large PA-C  Dispo: Home once pain controlled, cleared by PT, and medically stable. Patient hopeful for tomorrow.     Gerard CHRISTELLA Large, PA-C Office [phone] 06/18/2024, 9:18 AM

## 2024-06-19 ENCOUNTER — Encounter (HOSPITAL_COMMUNITY): Payer: Self-pay | Admitting: Orthopedic Surgery

## 2024-06-19 DIAGNOSIS — M25511 Pain in right shoulder: Secondary | ICD-10-CM | POA: Insufficient documentation

## 2024-06-19 LAB — BPAM RBC
Blood Product Expiration Date: 202507202359
ISSUE DATE / TIME: 202506270325
Unit Type and Rh: 5100

## 2024-06-19 LAB — AEROBIC/ANAEROBIC CULTURE W GRAM STAIN (SURGICAL/DEEP WOUND)
Culture: NO GROWTH
Culture: NO GROWTH
Gram Stain: NONE SEEN
Gram Stain: NONE SEEN

## 2024-06-19 LAB — TYPE AND SCREEN
ABO/RH(D): O POS
Antibody Screen: NEGATIVE
Unit division: 0

## 2024-06-19 MED ORDER — LORAZEPAM 1 MG PO TABS: 1.0000 mg | ORAL_TABLET | Freq: Three times a day (TID) | ORAL | 0 refills | Status: AC | PRN

## 2024-06-19 NOTE — Assessment & Plan Note (Signed)
 Counseling on cessation daily, patient seems interested in quitting

## 2024-06-19 NOTE — Progress Notes (Signed)
     Subjective: Patient reports that she has been doing much better worked great with physical therapy yesterday has been cleared by physical therapy.  Also improved tachycardia and medicine team is signed off.  Patient deemed stable for discharge home today.  Objective:   VITALS:   Vitals:   06/18/24 0750 06/18/24 1321 06/18/24 1941 06/19/24 0515  BP:  114/72 136/81 128/74  Pulse:  99 96 92  Resp:  16 18 19   Temp:  98.2 F (36.8 C) 98.3 F (36.8 C) (!) 97.4 F (36.3 C)  TempSrc:   Oral Oral  SpO2: 98% 99% 99% 100%  Weight:      Height:        Sensation intact distally Intact pulses distally Dorsiflexion/Plantar flexion intact Incision: dressing C/D/I Compartment soft Wound vac holding suction  Lab Results  Component Value Date   WBC 5.6 06/17/2024   HGB 8.3 (L) 06/17/2024   HCT 25.0 (L) 06/17/2024   MCV 95.4 06/17/2024   PLT 198 06/17/2024   BMET    Component Value Date/Time   NA 133 (L) 06/17/2024 0348   K 3.8 06/17/2024 0348   CL 99 06/17/2024 0348   CO2 26 06/17/2024 0348   GLUCOSE 93 06/17/2024 0348   BUN 8 06/17/2024 0348   CREATININE 0.56 06/17/2024 0348   CALCIUM 8.5 (L) 06/17/2024 0348   GFRNONAA >60 06/17/2024 0348    Xray: Distal femur replacement components in good position no adverse features  Assessment/Plan: 5 Days Post-Op   Principal Problem:   Closed fracture of right distal femur (HCC) Active Problems:   Osteoporosis   Vitamin D  deficiency   Nicotine dependence   Sinus tachycardia   Acute postoperative anemia due to expected blood loss   Macrocytic anemia   Hyponatremia   COPD (chronic obstructive pulmonary disease) (HCC)   Hypomagnesemia   Hypokalemia   Bilateral shoulder pain   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 Recs appreciated.  Post op recs: WB: WBAT-knee immobilizer for mobilization postop  for soft tissue protection given very thin skin. Abx: ancef  and cefadroxil  for extended prophylaxis postop IntraOp cultures no growth to date Imaging: PACU xrays DVT prophylaxis: Aspirin  81mg  BID x4 weeks Follow up: Follow-up this Thursday 7/3 for x-rays and wound check Address: [address], [place], [place] [address]  Office Phone: [phone]    TORIBIO DELENA HIGASHI 06/19/2024, 9:06 AM   [name], MD  Contact information:   [phone] 7am-5pm epic message Dr. HIGASHI, or call office for patient follow up: [phone] After hours and holidays please check [email or web address] for group call information for [facility]

## 2024-06-19 NOTE — Assessment & Plan Note (Signed)
 Replaced

## 2024-06-19 NOTE — Progress Notes (Signed)
 Connected patient to [facility] for dc and instructed patient on how to use.

## 2024-06-19 NOTE — Progress Notes (Signed)
 Case discussed with Dr. Edna.  Patient's heart rate has significantly improved.  Plan is to discharge her today to home.TRH signing off

## 2024-06-19 NOTE — Assessment & Plan Note (Signed)
 Upon thorough review of the patient's chart, patient did indeed have pulmonary function testing 07/2023 [facility] Pulmonary function testing consistent with significant obstructive lung disease consistent with COPD Patient would benefit from a combination long-acting beta agonist and long-acting inhaled steroid but is declining any steroids at this time.   Therefore, he patient should be encouraged to use albuterol  2-3 times daily and to follow-up with her primary care provider for further titration of her inhalers in the outpatient setting.  Wheezing has resolved since yesterday.

## 2024-06-19 NOTE — Assessment & Plan Note (Signed)
Vitamin D supplementation 

## 2024-06-19 NOTE — Assessment & Plan Note (Signed)
 Status post right knee revision 2 distal femur replacement 06/14/2024 Management per orthopedic service

## 2024-06-19 NOTE — TOC Transition Note (Signed)
 Transition of Care [facility]) - Discharge Note   Patient Details  Name: Madison Malone MRN: [id number] Date of Birth: 10-24-1966  Transition of [facility] CM/SW Contact:  Alfonse JONELLE Rex, RN Phone Number: 06/19/2024, 11:43 AM   Clinical Narrative:   DC to home with [facility] PT, Amy w/[facility] accepted for [facility] PT, added to AVS. No further TOC needs identified at this time.     Final next level of care: Home w Home Health Services Barriers to Discharge: Barriers Resolved   Patient Goals and CMS Choice Patient states their goals for this hospitalization and ongoing recovery are:: return home CMS Medicare.gov Compare Post Acute Care list provided to:: Patient Choice offered to / list presented to : Patient  ownership interest in [facility].provided to:: Patient    Discharge Placement                       Discharge Plan and Services Additional resources added to the After Visit Summary for                            [facility] Arranged: PT [facility] Agency: [facility] Date [facility] Agency Contacted: 06/19/24 Time HH Agency Contacted: 1143 Representative spoke with at [facility] Agency: Amy  Social Drivers of Health (SDOH) Interventions SDOH Screenings   Food Insecurity: No Food Insecurity (06/11/2024)  Housing: Low Risk  (06/11/2024)  Transportation Needs: No Transportation Needs (06/11/2024)  Utilities: Not At Risk (06/11/2024)  Depression (PHQ2-9): Low Risk  (07/21/2023)  Financial Resource Strain: Low Risk  (07/21/2023)  Tobacco Use: High Risk (06/14/2024)     Readmission Risk Interventions    06/15/2024    9:49 AM  Readmission Risk Prevention Plan  Post Dischage Appt Complete  Medication Screening Complete  Transportation Screening Complete

## 2024-06-19 NOTE — Assessment & Plan Note (Signed)
 X-rays performed 6/28 unremarkable Exam not consistent with specific rotator cuff injury or neuropathy Conservative management, outpatient follow-up, will defer management to orthopedic surgery.

## 2024-06-19 NOTE — Assessment & Plan Note (Signed)
 Essentially resolved.  Heart rate is essentially in the same range as patient's baseline during last previous presentations. Sinus tachycardia was likely multifactorial secondary to continued severe pain in the setting of postoperative anemia Patient is status post administration of intravenous fluids and 1 unit packed red blood cell transfusion Chest x-ray obtained 6/27 revealed no evidence of pneumonia TSH unremarkable Pulmonary embolism unlikely.

## 2024-06-19 NOTE — Progress Notes (Signed)
 CONSULT PROGRESS NOTE   Madison Malone  FMW:[id number] DOB: 1966/01/19 DOA: 06/10/2024 PCP: [name] ORN, MD   Date of Service: the patient was seen and examined on 06/18/2024  Brief Narrative:  58 year old with history of tobacco use disorder and osteoporosis with a recent right periprosthetic distal femur fracture status post right revision total knee arthroplasty on 6/25. On postop day 1, patient found to have elevated heart rate with HR persistently in the 110s to 120s. Patient given pain and anxiety medications without significant improvement in heart rate. EKG was done and TRH was consulted to assist with management of her tachycardia.   Assessment & Plan Closed fracture of right distal femur [facility]) Status post right knee revision 2 distal femur replacement 06/14/2024 Management per orthopedic service Sinus tachycardia Essentially resolved.  Heart rate is essentially in the same range as patient's baseline during last previous presentations. Sinus tachycardia was likely multifactorial secondary to continued severe pain in the setting of postoperative anemia Patient is status post administration of intravenous fluids and 1 unit packed red blood cell transfusion Chest x-ray obtained 6/27 revealed no evidence of pneumonia TSH unremarkable Pulmonary embolism unlikely. COPD (chronic obstructive pulmonary disease) (HCC) Upon thorough review of the patient's chart, patient did indeed have pulmonary function testing 07/2023 [facility] Pulmonary function testing consistent with significant obstructive lung disease consistent with COPD Patient would benefit from a combination long-acting beta agonist and long-acting inhaled steroid but is declining any steroids at this time.   Therefore, he patient should be encouraged to use albuterol  2-3 times daily and to follow-up with her primary care provider for further titration of her inhalers in the outpatient setting.  Wheezing has resolved since  yesterday.  Acute postoperative anemia due to expected blood loss Appropriate rise in hemoglobin status post 1 unit packed red blood cell transfusion on 6/24 Acute on chronic anemia likely secondary to acute blood loss from recent surgery No evidence of active bleeding Workup revealing iron deficiency, patient has since been placed on daily ferrous sulfate supplementation Monitoring hemoglobin and hematocrit with serial CBCs Bilateral shoulder pain X-rays performed 6/28 unremarkable Exam not consistent with specific rotator cuff injury or neuropathy Conservative management, outpatient follow-up, will defer management to orthopedic surgery. Hyponatremia Improved Likely secondary to hypovolemia/poor oral intake Vitamin D  deficiency Vitamin D  supplementation Nicotine dependence Counseling on cessation daily, patient seems interested in quitting Hypomagnesemia Replaced  Hypokalemia Replaced      Subjective:  Patient states that her knee pain continues to improve.  Patient also reports improvement in her wheezing and shortness of breath.   Physical Exam:  Vitals:   06/18/24 0522 06/18/24 0750 06/18/24 1321 06/18/24 1941  BP: 131/78  114/72 136/81  Pulse: 97  99 96  Resp: 18  16 18   Temp: (!) 97.3 F (36.3 C)  98.2 F (36.8 C) 98.3 F (36.8 C)  TempSrc: Oral   Oral  SpO2: 96% 98% 99% 99%  Weight:      Height:        Constitutional: Awake alert and oriented x3, no associated distress.   Skin: no rashes, no lesions, good skin turgor noted. Eyes: Pupils are equally reactive to light.  No evidence of scleral icterus or conjunctival pallor.  ENMT: Moist mucous membranes noted.  Posterior pharynx clear of any exudate or lesions.   Respiratory: Resolution of wheezing.  Diminished breath sounds with prolonged expiratory phase.  Normal respiratory effort. No accessory muscle use.  Cardiovascular: Regular rate and rhythm, no murmurs / rubs /  gallops.  Trace edema distal right  lower extremity.  2+ pedal pulses. No carotid bruits.  Abdomen: Abdomen is soft and nontender.  No evidence of intra-abdominal masses.  Positive bowel sounds noted in all quadrants.   Musculoskeletal: Pain with any manipulation of the right lower extremity.  Right knee immobilizer in place.  Data Reviewed:  I have personally reviewed and interpreted labs, imaging.  Significant findings are   CBC: Recent Labs  Lab 06/13/24 0612 06/13/24 1941 06/14/24 0718 06/15/24 0633 06/16/24 0127 06/16/24 0825 06/17/24 0348  WBC 4.8  --  4.6 6.1 7.0  --  5.6  NEUTROABS  --   --   --   --   --   --  3.9  HGB 6.9*   < > 8.4* 7.9* 9.2* 9.2* 8.3*  HCT 19.8*   < > 25.5* 23.8* 27.8* 27.2* 25.0*  MCV 100.5*  --  99.6 100.8* 102.2*  --  95.4  PLT 188  --  196 192 230  --  198   < > = values in this interval not displayed.   Basic Metabolic Panel: Recent Labs  Lab 06/13/24 0612 06/14/24 0718 06/16/24 0127 06/17/24 0348  NA 125* 129* 129* 133*  K 3.8 3.7 3.4* 3.8  CL 94* 95* 92* 99  CO2 22 23 22 26   GLUCOSE 90 97 114* 93  BUN 10 14 6 8   CREATININE 0.67 0.60 0.65 0.56  CALCIUM 8.5* 8.8* 8.4* 8.5*  MG  --   --  1.6* 2.1  PHOS  --   --  2.9  --    GFR: Estimated Creatinine Clearance: 60.5 mL/min (by C-G formula based on SCr of 0.56 mg/dL). Liver Function Tests: Recent Labs  Lab 06/17/24 0348  AST 22  ALT 12  ALKPHOS 49  BILITOT 0.8  PROT 5.8*  ALBUMIN  2.6*    EKG:  Personally reviewed.  Rhythm is sinus tachycardia.  Code Status:  Full code.     Time spent:  35minutes  Author:  Zachary JINNY Ba MD

## 2024-06-19 NOTE — Assessment & Plan Note (Signed)
 Improved Likely secondary to hypovolemia/poor oral intake

## 2024-06-19 NOTE — Assessment & Plan Note (Signed)
 Appropriate rise in hemoglobin status post 1 unit packed red blood cell transfusion on 6/24 Acute on chronic anemia likely secondary to acute blood loss from recent surgery No evidence of active bleeding Workup revealing iron deficiency, patient has since been placed on daily ferrous sulfate supplementation Monitoring hemoglobin and hematocrit with serial CBCs

## 2024-06-19 NOTE — Discharge Summary (Signed)
 Physician Discharge Summary  Patient ID: Madison Malone MRN: [id number] DOB/AGE: 58-Oct-1967 58 y.o.  Admit date: 06/10/2024 Discharge date: 06/19/2024  Admission Diagnoses:  Closed fracture of right distal femur [facility])  Discharge Diagnoses:  Principal Problem:   Closed fracture of right distal femur (HCC) Active Problems:   Osteoporosis   Vitamin D  deficiency   Nicotine dependence   Sinus tachycardia   Acute postoperative anemia due to expected blood loss   Macrocytic anemia   Hyponatremia   COPD (chronic obstructive pulmonary disease) (HCC)   Hypomagnesemia   Hypokalemia   Bilateral shoulder pain   Past Medical History:  Diagnosis Date   Anxiety    Arthritis    Chronic low back pain    GERD (gastroesophageal reflux disease)    Nicotine dependence 06/11/2024   Osteoporosis    Age 58 apparently per PCP records    Surgeries: Procedure(s): RIGHT KNEE REVISION TO DISTAL FEMUR REPLACEMENT on 06/14/2024   Consultants (if any): Treatment Team:  Kenard Zachary PARAS, MD  Discharged Condition: Improved  Hospital Course: Madison Malone is an 58 y.o. female who was admitted 06/10/2024 with a diagnosis of Closed fracture of right distal femur (HCC) and went to the operating room on 06/14/2024 and underwent the above named procedures.  Medicine was consulted for sinus tachycardia, responded well to fluid resuscitation and blood transfusion.  Other workup was unremarkable.  She progressed well with physical therapy was deemed safe for discharge on 06/19/2024.  She was given perioperative antibiotics:  Anti-infectives (From admission, onward)    Start     Dose/Rate Route Frequency Ordered Stop   06/17/24 2200  cefadroxil  (DURICEF) capsule 500 mg        500 mg Oral 2 times daily 06/14/24 1816 06/24/24 2159   06/14/24 2200  ceFAZolin  (ANCEF ) IVPB 2g/100 mL premix        2 g 200 mL/hr over 30 Minutes Intravenous Every 8 hours 06/14/24 1816 06/17/24 1602   06/14/24 1600  vancomycin   (VANCOCIN ) powder  Status:  Discontinued          As needed 06/14/24 1432 06/14/24 1757   06/14/24 1130  ceFAZolin  (ANCEF ) IVPB 2g/100 mL premix        2 g 200 mL/hr over 30 Minutes Intravenous On call to O.R. 06/14/24 0610 06/14/24 1400   06/14/24 0000  cefadroxil  (DURICEF) 500 MG capsule        500 mg Oral 2 times daily 06/14/24 1030 06/21/24 2359     .  She was given sequential compression devices, early ambulation, and aspirin  for DVT prophylaxis.  She benefited maximally from the hospital stay and there were no complications.    Recent vital signs:  Vitals:   06/18/24 1941 06/19/24 0515  BP: 136/81 128/74  Pulse: 96 92  Resp: 18 19  Temp: 98.3 F (36.8 C) (!) 97.4 F (36.3 C)  SpO2: 99% 100%    Recent laboratory studies:  Lab Results  Component Value Date   HGB 8.3 (L) 06/17/2024   HGB 9.2 (L) 06/16/2024   HGB 9.2 (L) 06/16/2024   Lab Results  Component Value Date   WBC 5.6 06/17/2024   PLT 198 06/17/2024   Lab Results  Component Value Date   INR 1.0 08/22/2022   Lab Results  Component Value Date   NA 133 (L) 06/17/2024   K 3.8 06/17/2024   CL 99 06/17/2024   CO2 26 06/17/2024   BUN 8 06/17/2024   CREATININE 0.56 06/17/2024  GLUCOSE 93 06/17/2024    Discharge Medications:   Allergies as of 06/19/2024       Reactions   Ibuprofen Other (See Comments)   Heartburn    Morphine  Itching, Other (See Comments)   Not effective. Insomnia, hyperactive         Medication List     TAKE these medications    acetaminophen  500 MG tablet Commonly known as: TYLENOL  Take 2 tablets (1,000 mg total) by mouth every 8 (eight) hours as needed. What changed:  how much to take when to take this reasons to take this   aspirin  EC 81 MG tablet Take 1 tablet (81 mg total) by mouth 2 (two) times daily for 28 days. Swallow whole.   cefadroxil  500 MG capsule Commonly known as: DURICEF Take 1 capsule (500 mg total) by mouth 2 (two) times daily for 7 days.    cyclobenzaprine  10 MG tablet Commonly known as: FLEXERIL  Take 2 tablets (20 mg total) by mouth 2 (two) times daily as needed for muscle spasms. What changed:  when to take this reasons to take this   diclofenac Sodium 1 % Gel Commonly known as: VOLTAREN Apply 1 Application topically 4 (four) times daily as needed (pain).   docusate sodium  100 MG capsule Commonly known as: COLACE Take 1 capsule (100 mg total) by mouth 2 (two) times daily. What changed: when to take this   LORazepam  1 MG tablet Commonly known as: ATIVAN  Take 1 tablet (1 mg total) by mouth every 8 (eight) hours as needed for anxiety.   Medical Compression Stockings Misc Please provide compression stockings   naproxen  500 MG EC tablet Commonly known as: EC NAPROSYN  Take 1 tablet (500 mg total) by mouth 2 (two) times daily with a meal.   omeprazole  40 MG capsule Commonly known as: PRILOSEC Take 1 capsule (40 mg total) by mouth daily for 21 days.   ondansetron  4 MG tablet Commonly known as: Zofran  Take 1 tablet (4 mg total) by mouth every 8 (eight) hours as needed for up to 14 days for nausea or vomiting.   oxyCODONE  5 MG immediate release tablet Commonly known as: Roxicodone  Take 1 tablet (5 mg total) by mouth every 4 (four) hours as needed for up to 7 days for severe pain (pain score 7-10) or moderate pain (pain score 4-6).   polyethylene glycol 17 g packet Commonly known as: MiraLax  Take 17 g by mouth daily.   potassium chloride  10 MEQ tablet Commonly known as: KLOR-CON  Take 10 mEq by mouth daily.   traZODone  50 MG tablet Commonly known as: DESYREL  Take 100 mg by mouth at bedtime.        Diagnostic Studies: DG Shoulder Right Result Date: 06/17/2024 CLINICAL DATA:  368653 Rotator cuff arthropathy 268653 EXAM: RIGHT SHOULDER - 2+ VIEW COMPARISON:  November 05, 2018 FINDINGS: No acute fracture or dislocation. There is no evidence of arthropathy or other focal bone abnormality. Soft tissues  are unremarkable. IMPRESSION: No acute fracture or dislocation. Electronically Signed   By: Rogelia Myers M.D.   On: 06/17/2024 19:50   DG Shoulder Left Port Result Date: 06/17/2024 CLINICAL DATA:  368653 Rotator cuff arthropathy 268653 EXAM: LEFT SHOULDER COMPARISON:  None Available. FINDINGS: No acute fracture or dislocation. There is no evidence of arthropathy or other focal bone abnormality. Soft tissues are unremarkable. Multilevel thoracic osteophytosis. IMPRESSION: No acute fracture or dislocation. Electronically Signed   By: Rogelia Myers M.D.   On: 06/17/2024 19:38   VAS  US  LOWER EXTREMITY VENOUS (DVT) Result Date: 06/17/2024  Lower Venous DVT Study Patient Name:  DAYLA GASCA  Date of Exam:   06/17/2024 Medical Rec #: [id number]          Accession #:    [id number] Date of Birth: Aug 08, 1966          Patient Gender: F Patient Age:   36 years Exam Location:  [facility] Procedure:      VAS US  LOWER EXTREMITY VENOUS (DVT) Referring Phys: CAROLINE MCBANE --------------------------------------------------------------------------------  Indications: Post operative welling. Other Indications: S/P right knee revision to distal femur replacement 06/14/24                    secondary to Right periprosthetic distal femur fracture. Limitations: Edema, pain with compression, positioning of leg. Comparison Study: Prior negative right LEV done 04/07/2024 at [facility] Performing Technologist: Alberta Lis RVS  Examination Guidelines: A complete evaluation includes B-mode imaging, spectral Doppler, color Doppler, and power Doppler as needed of all accessible portions of each vessel. Bilateral testing is considered an integral part of a complete examination. Limited examinations for reoccurring indications may be performed as noted. The reflux portion of the exam is performed with the patient in reverse Trendelenburg.  +---------+---------------+---------+-----------+----------+-------------------+ RIGHT     CompressibilityPhasicitySpontaneityPropertiesThrombus Aging      +---------+---------------+---------+-----------+----------+-------------------+ CFV      Full           Yes      No                                       +---------+---------------+---------+-----------+----------+-------------------+ SFJ      Full                                                             +---------+---------------+---------+-----------+----------+-------------------+ FV Prox  Full                                                             +---------+---------------+---------+-----------+----------+-------------------+ FV Mid                  Yes      Yes                  patent by color and                                                       Doppler             +---------+---------------+---------+-----------+----------+-------------------+ FV Distal               Yes      Yes                  patent by color and  Doppler             +---------+---------------+---------+-----------+----------+-------------------+ PFV      Full                                                             +---------+---------------+---------+-----------+----------+-------------------+ POP                     Yes      Yes                  patent by color and                                                       Doppler             +---------+---------------+---------+-----------+----------+-------------------+ PTV                                                   Not well visualized +---------+---------------+---------+-----------+----------+-------------------+ PERO                                                  Not well visualized +---------+---------------+---------+-----------+----------+-------------------+   +----+---------------+---------+-----------+----------+--------------+  LEFTCompressibilityPhasicitySpontaneityPropertiesThrombus Aging +----+---------------+---------+-----------+----------+--------------+ CFV Full           Yes      Yes                                 +----+---------------+---------+-----------+----------+--------------+ SFJ Full                                                        +----+---------------+---------+-----------+----------+--------------+     Summary: RIGHT: - There is no evidence of deep vein thrombosis in the lower extremity. However, portions of this examination were limited- see technologist comments above.  Interstitial edema noted throughout the right lower extremity   *See table(s) above for measurements and observations. Electronically signed by Norman Serve on 06/17/2024 at 3:45:21 PM.    Final    DG Chest 1 View Result Date: 06/16/2024 CLINICAL DATA:  Pneumonia. EXAM: CHEST  1 VIEW COMPARISON:  June 10, 2023 FINDINGS: The heart size and mediastinal contours are within normal limits. The lungs are mildly hyperinflated. Both lungs are clear. The visualized skeletal structures are unremarkable. IMPRESSION: No active cardiopulmonary disease. Electronically Signed   By: Suzen Dials M.D.   On: 06/16/2024 17:13   DG Knee Right Port Result Date: 06/14/2024 CLINICAL DATA:  Postop EXAM: PORTABLE RIGHT KNEE - 2 VIEW COMPARISON:  Preop x-ray 06/09/2024 FINDINGS: There is been removal of the previous total knee arthroplasty. There is been distal resection of the femur  and placement of a conjoined knee arthroplasty with cemented tibial component. Cerclage wire along the femoral component. These are long stems. Expected alignment. Adjacent soft tissue thickening and gas which is likely postoperative. Wound VAC seen anteriorly at the level of the proximal tibia. Imaging was obtained to aid in treatment. IMPRESSION: Interval revision of total knee arthroplasty with distal femoral osteotomy and placement of a conjoined new  arthroplasty with long stems. Electronically Signed   By: Ranell Bring M.D.   On: 06/14/2024 17:38   VAS US  ABI WITH/WO TBI Result Date: 06/11/2024  LOWER EXTREMITY DOPPLER STUDY Patient Name:  Shawntina Diffee  Date of Exam:   06/10/2024 Medical Rec #: [id number]          Accession #:    [id number] Date of Birth: Sep 07, 1966          Patient Gender: F Patient Age:   78 years Exam Location:  [facility] Procedure:      VAS US  ABI WITH/WO TBI Referring Phys: FRANCIS MT --------------------------------------------------------------------------------  Indications: Diminished pulses, mottled/purple feet. Edema. Right supracondylar              femur fracture. Right THA 2023, multiple right knee surgeries. High Risk Factors: Current smoker. Osteoporosis. Other Factors: Complex periprosthetic fracture.  Comparison Study: No prior ABI on file Performing Technologist: Rachel Pellet RVS  Examination Guidelines: A complete evaluation includes at minimum, Doppler waveform signals and systolic blood pressure reading at the level of bilateral brachial, anterior tibial, and posterior tibial arteries, when vessel segments are accessible. Bilateral testing is considered an integral part of a complete examination. Photoelectric Plethysmograph (PPG) waveforms and toe systolic pressure readings are included as required and additional duplex testing as needed. Limited examinations for reoccurring indications may be performed as noted.  ABI Findings: +---------+------------------+-----+-----------+--------+ Right    Rt Pressure (mmHg)IndexWaveform   Comment  +---------+------------------+-----+-----------+--------+ Brachial 128                    triphasic           +---------+------------------+-----+-----------+--------+ PTA      164               1.21 multiphasic         +---------+------------------+-----+-----------+--------+ DP       140               1.03 multiphasic          +---------+------------------+-----+-----------+--------+ Great Toe102               0.75 Abnormal            +---------+------------------+-----+-----------+--------+ +---------+------------------+-----+-----------+-------+ Left     Lt Pressure (mmHg)IndexWaveform   Comment +---------+------------------+-----+-----------+-------+ Brachial 136                    triphasic          +---------+------------------+-----+-----------+-------+ PTA      192               1.41 multiphasic        +---------+------------------+-----+-----------+-------+ DP       187               1.38 multiphasic        +---------+------------------+-----+-----------+-------+ Great Toe126               0.93 Abnormal           +---------+------------------+-----+-----------+-------+ +-------+-------------------+-----------+------------+------------+ ABI/TBIToday's ABI        Today's TBIPrevious  ABIPrevious TBI +-------+-------------------+-----------+------------+------------+ Right  1.2                0.75                                +-------+-------------------+-----------+------------+------------+ Left   1.4 noncompressible0.93                                +-------+-------------------+-----------+------------+------------+ Arterial wall calcification precludes accurate ankle pressures and ABIs.  Summary: Right: Resting right ankle-brachial index is within normal range. The right toe-brachial index is normal. Normal TBI with abnormal waveform. Left: Resting left ankle-brachial index indicates noncompressible left lower extremity arteries. The left toe-brachial index is normal. Normal TBI with abnormal waveform. *See table(s) above for measurements and observations.  Electronically signed by Penne Colorado MD on 06/11/2024 at 10:30:05 AM.    Final    CT KNEE RIGHT WO CONTRAST Result Date: 06/10/2024 CLINICAL DATA:  Periprosthetic distal femoral fracture EXAM: CT OF THE RIGHT KNEE  WITHOUT CONTRAST TECHNIQUE: Multidetector CT imaging of the right knee was performed according to the standard protocol. Multiplanar CT image reconstructions were also generated. RADIATION DOSE REDUCTION: This exam was performed according to the departmental dose-optimization program which includes automated exposure control, adjustment of the mA and/or kV according to patient size and/or use of iterative reconstruction technique. COMPARISON:  Right knee radiograph dated 06/09/2024 FINDINGS: Bones/Joint/Cartilage Prior right knee arthroplasty. The tibial component appears intact and well seated. The femoral component also appears intact, however a comminuted distal femoral metadiaphyseal fracture extends into the interface with the femoral component. There is impaction and 1/2 shaft width posterior and lateral displacement of the dominant fracture fragment. Ligaments Suboptimally assessed by CT. Muscles and Tendons Grossly intact, although the distal femoral thigh musculature is expanded and heterogeneous, related to intramuscular hematoma. Soft tissues Diffuse soft tissue edema about the knee. Suspected joint effusion, although suboptimally evaluated due to beam hardening artifact from the knee arthroplasty. IMPRESSION: 1. Comminuted, impacted, and displaced distal femoral metadiaphyseal fracture extends into the interface with the femoral component of the right knee arthroplasty. 2. Intramuscular hematoma of the distal femoral thigh. Electronically Signed   By: Limin  Xu M.D.   On: 06/10/2024 10:05   DG Hip Unilat W or Wo Pelvis 2-3 Views Right Result Date: 06/10/2024 CLINICAL DATA:  Fall, right distal femoral fracture. EXAM: DG HIP (WITH OR WITHOUT PELVIS) 2-3V RIGHT COMPARISON:  None Available. FINDINGS: Right total hip arthroplasty has been performed. No acute fracture or dislocation. Sacroiliac and left hip joint spaces are preserved. Soft tissues are unremarkable. IMPRESSION: 1. Right total hip  arthroplasty. No acute fracture or dislocation. Electronically Signed   By: Dorethia Molt M.D.   On: 06/10/2024 00:33   DG Knee 1-2 Views Right Result Date: 06/09/2024 CLINICAL DATA:  Fall. EXAM: RIGHT KNEE - 1-2 VIEW COMPARISON:  None Available. FINDINGS: Right knee arthroplasty is present. There is an acute impacted transverse fracture through the distal femoral diaphysis at the level of the prosthesis. There is apex anterior angulation. There is 12 mm of anterior displacement of proximal fracture fragment. Fracture is mildly comminuted. There is diffuse soft tissue swelling surrounding the knee. There is no dislocation. IMPRESSION: Acute impacted transverse fracture through the distal femoral diaphysis at the level of the prosthesis. Electronically Signed   By: Greig Pique M.D.   On: 06/09/2024 23:43  Disposition: Discharge disposition: 06-Home-Health Care Svc       Discharge Instructions     Call MD / Call 911   Complete by: As directed    If you experience chest pain or shortness of breath, CALL 911 and be transported to the hospital emergency room.  If you develope a fever above 101 F, pus (white drainage) or increased drainage or redness at the wound, or calf pain, call your surgeon's office.   Constipation Prevention   Complete by: As directed    Drink plenty of fluids.  Prune juice may be helpful.  You may use a stool softener, such as Colace (over the counter) 100 mg twice a day.  Use MiraLax  (over the counter) for constipation as needed.   Diet - low sodium heart healthy   Complete by: As directed    Increase activity slowly as tolerated   Complete by: As directed    Post-operative opioid taper instructions:   Complete by: As directed    POST-OPERATIVE OPIOID TAPER INSTRUCTIONS: It is important to wean off of your opioid medication as soon as possible. If you do not need pain medication after your surgery it is ok to stop day one. Opioids include: Codeine,  Hydrocodone (Norco, Vicodin), Oxycodone (Percocet, oxycontin ) and hydromorphone  amongst others.  Long term and even short term use of opiods can cause: Increased pain response Dependence Constipation Depression Respiratory depression And more.  Withdrawal symptoms can include Flu like symptoms Nausea, vomiting And more Techniques to manage these symptoms Hydrate well Eat regular healthy meals Stay active Use relaxation techniques(deep breathing, meditating, yoga) Do Not substitute Alcohol  to help with tapering If you have been on opioids for less than two weeks and do not have pain than it is ok to stop all together.  Plan to wean off of opioids This plan should start within one week post op of your joint replacement. Maintain the same interval or time between taking each dose and first decrease the dose.  Cut the total daily intake of opioids by one tablet each day Next start to increase the time between doses. The last dose that should be eliminated is the evening dose.             Discharge Instructions   None     Signed: Ronen Bromwell A Erasto Sleight 06/19/2024, 9:17 AM
